# Patient Record
Sex: Female | Born: 1950 | Race: White | Hispanic: No | Smoking: Current every day smoker
Health system: Southern US, Community
[De-identification: ages and names within clinical notes are randomized; demographics above are authoritative.]

## PROBLEM LIST (undated history)

## (undated) DIAGNOSIS — M797 Fibromyalgia: Secondary | ICD-10-CM

## (undated) DIAGNOSIS — N390 Urinary tract infection, site not specified: Secondary | ICD-10-CM

## (undated) DIAGNOSIS — K8020 Calculus of gallbladder without cholecystitis without obstruction: Secondary | ICD-10-CM

## (undated) DIAGNOSIS — E785 Hyperlipidemia, unspecified: Secondary | ICD-10-CM

## (undated) DIAGNOSIS — F341 Dysthymic disorder: Secondary | ICD-10-CM

## (undated) DIAGNOSIS — N3010 Interstitial cystitis (chronic) without hematuria: Secondary | ICD-10-CM

## (undated) DIAGNOSIS — K635 Polyp of colon: Secondary | ICD-10-CM

## (undated) DIAGNOSIS — F32A Depression, unspecified: Secondary | ICD-10-CM

## (undated) DIAGNOSIS — I2510 Atherosclerotic heart disease of native coronary artery without angina pectoris: Secondary | ICD-10-CM

## (undated) DIAGNOSIS — F419 Anxiety disorder, unspecified: Secondary | ICD-10-CM

## (undated) DIAGNOSIS — I10 Essential (primary) hypertension: Secondary | ICD-10-CM

## (undated) DIAGNOSIS — M545 Low back pain, unspecified: Secondary | ICD-10-CM

## (undated) DIAGNOSIS — K589 Irritable bowel syndrome without diarrhea: Secondary | ICD-10-CM

## (undated) DIAGNOSIS — K839 Disease of biliary tract, unspecified: Secondary | ICD-10-CM

## (undated) DIAGNOSIS — K219 Gastro-esophageal reflux disease without esophagitis: Secondary | ICD-10-CM

## (undated) DIAGNOSIS — K5730 Diverticulosis of large intestine without perforation or abscess without bleeding: Secondary | ICD-10-CM

## (undated) DIAGNOSIS — Z8709 Personal history of other diseases of the respiratory system: Secondary | ICD-10-CM

## (undated) DIAGNOSIS — J189 Pneumonia, unspecified organism: Secondary | ICD-10-CM

## (undated) DIAGNOSIS — Z8619 Personal history of other infectious and parasitic diseases: Secondary | ICD-10-CM

## (undated) DIAGNOSIS — H209 Unspecified iridocyclitis: Secondary | ICD-10-CM

## (undated) DIAGNOSIS — R51 Headache: Secondary | ICD-10-CM

## (undated) DIAGNOSIS — F329 Major depressive disorder, single episode, unspecified: Secondary | ICD-10-CM

## (undated) HISTORY — DX: Interstitial cystitis (chronic) without hematuria: N30.10

## (undated) HISTORY — DX: Essential (primary) hypertension: I10

## (undated) HISTORY — DX: Gastro-esophageal reflux disease without esophagitis: K21.9

## (undated) HISTORY — PX: OTHER SURGICAL HISTORY: SHX169

## (undated) HISTORY — DX: Hyperlipidemia, unspecified: E78.5

## (undated) HISTORY — DX: Anxiety disorder, unspecified: F41.9

## (undated) HISTORY — DX: Unspecified iridocyclitis: H20.9

## (undated) HISTORY — DX: Low back pain, unspecified: M54.50

## (undated) HISTORY — DX: Atherosclerotic heart disease of native coronary artery without angina pectoris: I25.10

## (undated) HISTORY — DX: Depression, unspecified: F32.A

## (undated) HISTORY — DX: Calculus of gallbladder without cholecystitis without obstruction: K80.20

## (undated) HISTORY — DX: Low back pain: M54.5

## (undated) HISTORY — DX: Headache: R51

## (undated) HISTORY — DX: Diverticulosis of large intestine without perforation or abscess without bleeding: K57.30

## (undated) HISTORY — DX: Urinary tract infection, site not specified: N39.0

## (undated) HISTORY — DX: Personal history of other infectious and parasitic diseases: Z86.19

## (undated) HISTORY — DX: Fibromyalgia: M79.7

## (undated) HISTORY — DX: Pneumonia, unspecified organism: J18.9

## (undated) HISTORY — DX: Disease of biliary tract, unspecified: K83.9

## (undated) HISTORY — DX: Polyp of colon: K63.5

## (undated) HISTORY — PX: TUBAL LIGATION: SHX77

## (undated) HISTORY — DX: Major depressive disorder, single episode, unspecified: F32.9

## (undated) HISTORY — PX: TONSILLECTOMY: SUR1361

## (undated) HISTORY — DX: Personal history of other diseases of the respiratory system: Z87.09

## (undated) HISTORY — DX: Irritable bowel syndrome, unspecified: K58.9

## (undated) HISTORY — DX: Dysthymic disorder: F34.1

---

## 1971-07-20 HISTORY — PX: CHOLECYSTECTOMY: SHX55

## 1971-07-20 HISTORY — PX: APPENDECTOMY: SHX54

## 1987-07-20 HISTORY — PX: OTHER SURGICAL HISTORY: SHX169

## 1997-11-12 ENCOUNTER — Emergency Department (HOSPITAL_COMMUNITY): Admission: EM | Admit: 1997-11-12 | Discharge: 1997-11-12 | Payer: Self-pay | Admitting: Emergency Medicine

## 1997-11-19 ENCOUNTER — Encounter: Payer: Self-pay | Admitting: Gastroenterology

## 1998-09-30 ENCOUNTER — Encounter: Payer: Self-pay | Admitting: Pulmonary Disease

## 1998-09-30 ENCOUNTER — Ambulatory Visit (HOSPITAL_COMMUNITY): Admission: RE | Admit: 1998-09-30 | Discharge: 1998-09-30 | Payer: Self-pay | Admitting: Pulmonary Disease

## 1999-01-07 ENCOUNTER — Emergency Department (HOSPITAL_COMMUNITY): Admission: EM | Admit: 1999-01-07 | Discharge: 1999-01-07 | Payer: Self-pay | Admitting: Emergency Medicine

## 1999-06-24 ENCOUNTER — Emergency Department (HOSPITAL_COMMUNITY): Admission: EM | Admit: 1999-06-24 | Discharge: 1999-06-24 | Payer: Self-pay | Admitting: *Deleted

## 1999-07-08 ENCOUNTER — Encounter: Payer: Self-pay | Admitting: Emergency Medicine

## 1999-07-08 ENCOUNTER — Emergency Department (HOSPITAL_COMMUNITY): Admission: EM | Admit: 1999-07-08 | Discharge: 1999-07-08 | Payer: Self-pay | Admitting: Emergency Medicine

## 1999-08-26 ENCOUNTER — Emergency Department (HOSPITAL_COMMUNITY): Admission: EM | Admit: 1999-08-26 | Discharge: 1999-08-26 | Payer: Self-pay | Admitting: Emergency Medicine

## 1999-09-04 ENCOUNTER — Encounter: Admission: RE | Admit: 1999-09-04 | Discharge: 1999-09-04 | Payer: Self-pay | Admitting: Family Medicine

## 1999-10-06 ENCOUNTER — Encounter: Admission: RE | Admit: 1999-10-06 | Discharge: 1999-10-06 | Payer: Self-pay | Admitting: Sports Medicine

## 1999-10-21 ENCOUNTER — Ambulatory Visit (HOSPITAL_COMMUNITY): Admission: RE | Admit: 1999-10-21 | Discharge: 1999-10-21 | Payer: Self-pay | Admitting: *Deleted

## 1999-11-04 ENCOUNTER — Other Ambulatory Visit: Admission: RE | Admit: 1999-11-04 | Discharge: 1999-11-04 | Payer: Self-pay | Admitting: Family Medicine

## 1999-11-04 ENCOUNTER — Encounter: Admission: RE | Admit: 1999-11-04 | Discharge: 1999-11-04 | Payer: Self-pay | Admitting: Family Medicine

## 1999-11-17 ENCOUNTER — Encounter: Admission: RE | Admit: 1999-11-17 | Discharge: 1999-11-17 | Payer: Self-pay | Admitting: Family Medicine

## 1999-12-15 ENCOUNTER — Encounter: Admission: RE | Admit: 1999-12-15 | Discharge: 1999-12-15 | Payer: Self-pay | Admitting: Sports Medicine

## 2000-02-02 ENCOUNTER — Emergency Department (HOSPITAL_COMMUNITY): Admission: EM | Admit: 2000-02-02 | Discharge: 2000-02-02 | Payer: Self-pay | Admitting: Emergency Medicine

## 2000-06-10 ENCOUNTER — Emergency Department (HOSPITAL_COMMUNITY): Admission: EM | Admit: 2000-06-10 | Discharge: 2000-06-10 | Payer: Self-pay | Admitting: Emergency Medicine

## 2000-08-11 ENCOUNTER — Encounter: Admission: RE | Admit: 2000-08-11 | Discharge: 2000-08-11 | Payer: Self-pay | Admitting: Family Medicine

## 2000-11-03 ENCOUNTER — Encounter: Payer: Self-pay | Admitting: Pulmonary Disease

## 2000-11-03 ENCOUNTER — Ambulatory Visit (HOSPITAL_COMMUNITY): Admission: RE | Admit: 2000-11-03 | Discharge: 2000-11-03 | Payer: Self-pay | Admitting: Pulmonary Disease

## 2000-12-06 ENCOUNTER — Ambulatory Visit (HOSPITAL_COMMUNITY): Admission: RE | Admit: 2000-12-06 | Discharge: 2000-12-06 | Payer: Self-pay | Admitting: Sports Medicine

## 2000-12-06 ENCOUNTER — Encounter: Payer: Self-pay | Admitting: Sports Medicine

## 2001-01-02 ENCOUNTER — Emergency Department (HOSPITAL_COMMUNITY): Admission: EM | Admit: 2001-01-02 | Discharge: 2001-01-02 | Payer: Self-pay | Admitting: Emergency Medicine

## 2001-01-07 ENCOUNTER — Emergency Department (HOSPITAL_COMMUNITY): Admission: EM | Admit: 2001-01-07 | Discharge: 2001-01-08 | Payer: Self-pay | Admitting: Emergency Medicine

## 2001-01-07 ENCOUNTER — Encounter: Payer: Self-pay | Admitting: Emergency Medicine

## 2001-02-03 ENCOUNTER — Encounter: Payer: Self-pay | Admitting: Emergency Medicine

## 2001-02-03 ENCOUNTER — Emergency Department (HOSPITAL_COMMUNITY): Admission: EM | Admit: 2001-02-03 | Discharge: 2001-02-04 | Payer: Self-pay | Admitting: Emergency Medicine

## 2001-06-19 ENCOUNTER — Ambulatory Visit (HOSPITAL_COMMUNITY): Admission: RE | Admit: 2001-06-19 | Discharge: 2001-06-19 | Payer: Self-pay | Admitting: Sports Medicine

## 2001-06-19 ENCOUNTER — Encounter: Payer: Self-pay | Admitting: Sports Medicine

## 2001-11-28 ENCOUNTER — Encounter: Payer: Self-pay | Admitting: Pulmonary Disease

## 2001-11-28 ENCOUNTER — Ambulatory Visit (HOSPITAL_COMMUNITY): Admission: RE | Admit: 2001-11-28 | Discharge: 2001-11-28 | Payer: Self-pay | Admitting: Pulmonary Disease

## 2002-02-05 ENCOUNTER — Encounter: Payer: Self-pay | Admitting: Emergency Medicine

## 2002-02-05 ENCOUNTER — Emergency Department (HOSPITAL_COMMUNITY): Admission: EM | Admit: 2002-02-05 | Discharge: 2002-02-05 | Payer: Self-pay | Admitting: Emergency Medicine

## 2002-12-26 ENCOUNTER — Emergency Department (HOSPITAL_COMMUNITY): Admission: EM | Admit: 2002-12-26 | Discharge: 2002-12-27 | Payer: Self-pay | Admitting: Emergency Medicine

## 2003-01-10 ENCOUNTER — Emergency Department (HOSPITAL_COMMUNITY): Admission: EM | Admit: 2003-01-10 | Discharge: 2003-01-10 | Payer: Self-pay | Admitting: Emergency Medicine

## 2003-02-12 ENCOUNTER — Ambulatory Visit (HOSPITAL_COMMUNITY): Admission: RE | Admit: 2003-02-12 | Discharge: 2003-02-12 | Payer: Self-pay | Admitting: Pulmonary Disease

## 2003-02-12 ENCOUNTER — Encounter: Payer: Self-pay | Admitting: Pulmonary Disease

## 2003-07-25 ENCOUNTER — Emergency Department (HOSPITAL_COMMUNITY): Admission: EM | Admit: 2003-07-25 | Discharge: 2003-07-25 | Payer: Self-pay | Admitting: Emergency Medicine

## 2004-08-03 ENCOUNTER — Ambulatory Visit: Payer: Self-pay | Admitting: Pulmonary Disease

## 2004-08-11 ENCOUNTER — Ambulatory Visit: Payer: Self-pay | Admitting: Pulmonary Disease

## 2004-10-09 ENCOUNTER — Emergency Department (HOSPITAL_COMMUNITY): Admission: EM | Admit: 2004-10-09 | Discharge: 2004-10-09 | Payer: Self-pay | Admitting: Family Medicine

## 2005-01-05 ENCOUNTER — Ambulatory Visit: Payer: Self-pay | Admitting: Family Medicine

## 2005-01-05 ENCOUNTER — Encounter (INDEPENDENT_AMBULATORY_CARE_PROVIDER_SITE_OTHER): Payer: Self-pay | Admitting: *Deleted

## 2005-01-21 ENCOUNTER — Encounter (INDEPENDENT_AMBULATORY_CARE_PROVIDER_SITE_OTHER): Payer: Self-pay | Admitting: *Deleted

## 2005-02-09 ENCOUNTER — Ambulatory Visit: Payer: Self-pay | Admitting: Family Medicine

## 2005-03-08 ENCOUNTER — Ambulatory Visit (HOSPITAL_COMMUNITY): Admission: RE | Admit: 2005-03-08 | Discharge: 2005-03-08 | Payer: Self-pay | Admitting: Family Medicine

## 2005-03-23 ENCOUNTER — Encounter: Admission: RE | Admit: 2005-03-23 | Discharge: 2005-03-23 | Payer: Self-pay | Admitting: Sports Medicine

## 2005-05-12 ENCOUNTER — Ambulatory Visit: Payer: Self-pay | Admitting: Pulmonary Disease

## 2005-10-15 ENCOUNTER — Emergency Department: Payer: Self-pay | Admitting: Emergency Medicine

## 2005-11-17 ENCOUNTER — Ambulatory Visit: Payer: Self-pay | Admitting: Family Medicine

## 2005-12-16 ENCOUNTER — Ambulatory Visit: Payer: Self-pay | Admitting: Physician Assistant

## 2005-12-29 ENCOUNTER — Emergency Department: Payer: Self-pay | Admitting: Emergency Medicine

## 2006-01-05 ENCOUNTER — Emergency Department: Payer: Self-pay | Admitting: Unknown Physician Specialty

## 2006-03-28 ENCOUNTER — Emergency Department: Payer: Self-pay | Admitting: Emergency Medicine

## 2006-05-11 ENCOUNTER — Ambulatory Visit: Payer: Self-pay | Admitting: Pulmonary Disease

## 2006-05-30 ENCOUNTER — Ambulatory Visit: Payer: Self-pay | Admitting: Pulmonary Disease

## 2006-05-30 LAB — CONVERTED CEMR LAB
Crystals: NEGATIVE
Ketones, ur: NEGATIVE mg/dL
Specific Gravity, Urine: >=1.03 (ref 1.000–1.03)
Urine Glucose: NEGATIVE mg/dL
Urobilinogen, UA: 0.2 (ref 0.0–1.0)
pH: 5.5 (ref 5.0–8.0)

## 2006-06-13 ENCOUNTER — Ambulatory Visit: Payer: Self-pay | Admitting: Pulmonary Disease

## 2006-08-18 ENCOUNTER — Ambulatory Visit: Payer: Self-pay | Admitting: Urology

## 2006-09-16 ENCOUNTER — Encounter (INDEPENDENT_AMBULATORY_CARE_PROVIDER_SITE_OTHER): Payer: Self-pay | Admitting: *Deleted

## 2006-09-21 ENCOUNTER — Ambulatory Visit: Payer: Self-pay | Admitting: Pulmonary Disease

## 2006-10-07 ENCOUNTER — Emergency Department (HOSPITAL_COMMUNITY): Admission: EM | Admit: 2006-10-07 | Discharge: 2006-10-07 | Payer: Self-pay | Admitting: Emergency Medicine

## 2006-10-12 ENCOUNTER — Ambulatory Visit: Payer: Self-pay | Admitting: Gastroenterology

## 2006-10-12 LAB — CONVERTED CEMR LAB
ALT: 19 units/L (ref 0–40)
AST: 19 units/L (ref 0–37)
Albumin: 3.8 g/dL (ref 3.5–5.2)
Amylase: 68 units/L (ref 27–131)
Total Bilirubin: 0.5 mg/dL (ref 0.3–1.2)

## 2006-10-13 ENCOUNTER — Ambulatory Visit (HOSPITAL_COMMUNITY): Admission: RE | Admit: 2006-10-13 | Discharge: 2006-10-13 | Payer: Self-pay | Admitting: Gastroenterology

## 2006-10-13 ENCOUNTER — Encounter (INDEPENDENT_AMBULATORY_CARE_PROVIDER_SITE_OTHER): Payer: Self-pay | Admitting: *Deleted

## 2006-10-17 ENCOUNTER — Inpatient Hospital Stay (HOSPITAL_COMMUNITY): Admission: EM | Admit: 2006-10-17 | Discharge: 2006-10-19 | Payer: Self-pay | Admitting: Emergency Medicine

## 2006-10-18 ENCOUNTER — Ambulatory Visit: Payer: Self-pay | Admitting: Gastroenterology

## 2006-10-25 ENCOUNTER — Ambulatory Visit: Payer: Self-pay | Admitting: Gastroenterology

## 2006-11-01 ENCOUNTER — Ambulatory Visit: Payer: Self-pay | Admitting: Gastroenterology

## 2006-11-01 LAB — CONVERTED CEMR LAB
AST: 32 units/L (ref 0–37)
Alkaline Phosphatase: 118 units/L — ABNORMAL HIGH (ref 39–117)
Ammonia: 15 umol/L (ref 11–35)
Bilirubin, Direct: 0.1 mg/dL (ref 0.0–0.3)
Total Protein: 6.5 g/dL (ref 6.0–8.3)

## 2006-11-02 ENCOUNTER — Ambulatory Visit: Payer: Self-pay | Admitting: Gastroenterology

## 2006-11-04 ENCOUNTER — Ambulatory Visit (HOSPITAL_COMMUNITY): Admission: RE | Admit: 2006-11-04 | Discharge: 2006-11-04 | Payer: Self-pay | Admitting: Gastroenterology

## 2006-11-04 ENCOUNTER — Encounter: Payer: Self-pay | Admitting: Gastroenterology

## 2006-12-05 ENCOUNTER — Emergency Department: Payer: Self-pay | Admitting: Emergency Medicine

## 2006-12-14 ENCOUNTER — Ambulatory Visit: Payer: Self-pay | Admitting: Gastroenterology

## 2007-01-09 ENCOUNTER — Ambulatory Visit: Payer: Self-pay | Admitting: Gastroenterology

## 2007-05-18 ENCOUNTER — Ambulatory Visit: Payer: Self-pay | Admitting: Pulmonary Disease

## 2007-05-18 DIAGNOSIS — K219 Gastro-esophageal reflux disease without esophagitis: Secondary | ICD-10-CM | POA: Insufficient documentation

## 2007-05-18 DIAGNOSIS — R519 Headache, unspecified: Secondary | ICD-10-CM | POA: Insufficient documentation

## 2007-05-18 DIAGNOSIS — K589 Irritable bowel syndrome without diarrhea: Secondary | ICD-10-CM | POA: Insufficient documentation

## 2007-05-18 DIAGNOSIS — M545 Low back pain: Secondary | ICD-10-CM | POA: Insufficient documentation

## 2007-05-18 DIAGNOSIS — IMO0001 Reserved for inherently not codable concepts without codable children: Secondary | ICD-10-CM

## 2007-05-18 DIAGNOSIS — R51 Headache: Secondary | ICD-10-CM

## 2007-06-01 ENCOUNTER — Ambulatory Visit: Payer: Self-pay | Admitting: Pulmonary Disease

## 2007-08-30 ENCOUNTER — Ambulatory Visit: Payer: Self-pay | Admitting: Pulmonary Disease

## 2007-09-04 ENCOUNTER — Telehealth (INDEPENDENT_AMBULATORY_CARE_PROVIDER_SITE_OTHER): Payer: Self-pay | Admitting: *Deleted

## 2007-09-06 ENCOUNTER — Ambulatory Visit: Payer: Self-pay | Admitting: Pulmonary Disease

## 2007-09-13 ENCOUNTER — Telehealth (INDEPENDENT_AMBULATORY_CARE_PROVIDER_SITE_OTHER): Payer: Self-pay | Admitting: *Deleted

## 2007-09-14 ENCOUNTER — Ambulatory Visit: Payer: Self-pay | Admitting: Pulmonary Disease

## 2007-09-16 DIAGNOSIS — N3010 Interstitial cystitis (chronic) without hematuria: Secondary | ICD-10-CM

## 2007-09-16 DIAGNOSIS — K5730 Diverticulosis of large intestine without perforation or abscess without bleeding: Secondary | ICD-10-CM | POA: Insufficient documentation

## 2007-09-16 DIAGNOSIS — H209 Unspecified iridocyclitis: Secondary | ICD-10-CM

## 2007-09-16 DIAGNOSIS — N390 Urinary tract infection, site not specified: Secondary | ICD-10-CM

## 2007-09-16 DIAGNOSIS — F341 Dysthymic disorder: Secondary | ICD-10-CM

## 2007-09-16 DIAGNOSIS — F17200 Nicotine dependence, unspecified, uncomplicated: Secondary | ICD-10-CM

## 2007-09-16 DIAGNOSIS — K839 Disease of biliary tract, unspecified: Secondary | ICD-10-CM | POA: Insufficient documentation

## 2007-09-16 DIAGNOSIS — J40 Bronchitis, not specified as acute or chronic: Secondary | ICD-10-CM

## 2007-09-16 LAB — CONVERTED CEMR LAB
Alkaline Phosphatase: 101 units/L (ref 39–117)
Angiotensin 1 Converting Enzyme: 42 units/L (ref 9–67)
Anti Nuclear Antibody(ANA): NEGATIVE
BUN: 4 mg/dL — ABNORMAL LOW (ref 6–23)
Basophils Relative: 0.9 % (ref 0.0–1.0)
Bilirubin, Direct: 0.1 mg/dL (ref 0.0–0.3)
CO2: 32 meq/L (ref 19–32)
Creatinine, Ser: 0.6 mg/dL (ref 0.4–1.2)
Crystals: NEGATIVE
GFR calc Af Amer: 133 mL/min
HCT: 43.1 % (ref 36.0–46.0)
HDL: 55.1 mg/dL (ref >39.0)
Hemoglobin: 13.9 g/dL (ref 12.0–15.0)
Ketones, ur: NEGATIVE mg/dL
Lymphocytes Relative: 25.5 % (ref 12.0–46.0)
MCHC: 32.4 g/dL (ref 30.0–36.0)
Monocytes Relative: 8.3 % (ref 3.0–11.0)
Neutro Abs: 4.5 10*3/uL (ref 1.4–7.7)
Nitrite: NEGATIVE
Potassium: 3.8 meq/L (ref 3.5–5.1)
RBC: 4.48 M/uL (ref 3.87–5.11)
RDW: 12.6 % (ref 11.5–14.6)
Sed Rate: 30 mm/hr — ABNORMAL HIGH (ref 0–25)
Sodium: 138 meq/L (ref 135–145)
Specific Gravity, Urine: 1.01 (ref 1.000–1.03)
TSH: 0.69 microintl units/mL (ref 0.35–5.50)
Total Bilirubin: 0.7 mg/dL (ref 0.3–1.2)
Total Protein, Urine: NEGATIVE mg/dL
Triglycerides: 75 mg/dL (ref 0–149)
Urine Glucose: NEGATIVE mg/dL
WBC: 7.4 10*3/uL (ref 4.5–10.5)
pH: 6 (ref 5.0–8.0)

## 2007-11-11 DIAGNOSIS — T733XXA Exhaustion due to excessive exertion, initial encounter: Secondary | ICD-10-CM

## 2007-11-11 DIAGNOSIS — M2550 Pain in unspecified joint: Secondary | ICD-10-CM | POA: Insufficient documentation

## 2007-11-11 DIAGNOSIS — M549 Dorsalgia, unspecified: Secondary | ICD-10-CM | POA: Insufficient documentation

## 2007-11-29 ENCOUNTER — Emergency Department: Payer: Self-pay | Admitting: Unknown Physician Specialty

## 2008-01-01 ENCOUNTER — Ambulatory Visit: Payer: Self-pay | Admitting: Gynecology

## 2008-02-21 ENCOUNTER — Telehealth: Payer: Self-pay | Admitting: Pulmonary Disease

## 2008-02-26 ENCOUNTER — Ambulatory Visit: Payer: Self-pay | Admitting: Pulmonary Disease

## 2008-03-16 ENCOUNTER — Emergency Department: Payer: Self-pay | Admitting: Emergency Medicine

## 2008-04-23 IMAGING — CR DG CHEST 2V
1 series · 1 of 1 positions shown · non-contrast
Comparison: 05/11/06.

CLINICAL DATA: Right-sided pain, smoker.
 CHEST - 2 VIEW - 08/30/07:

[view not recorded]
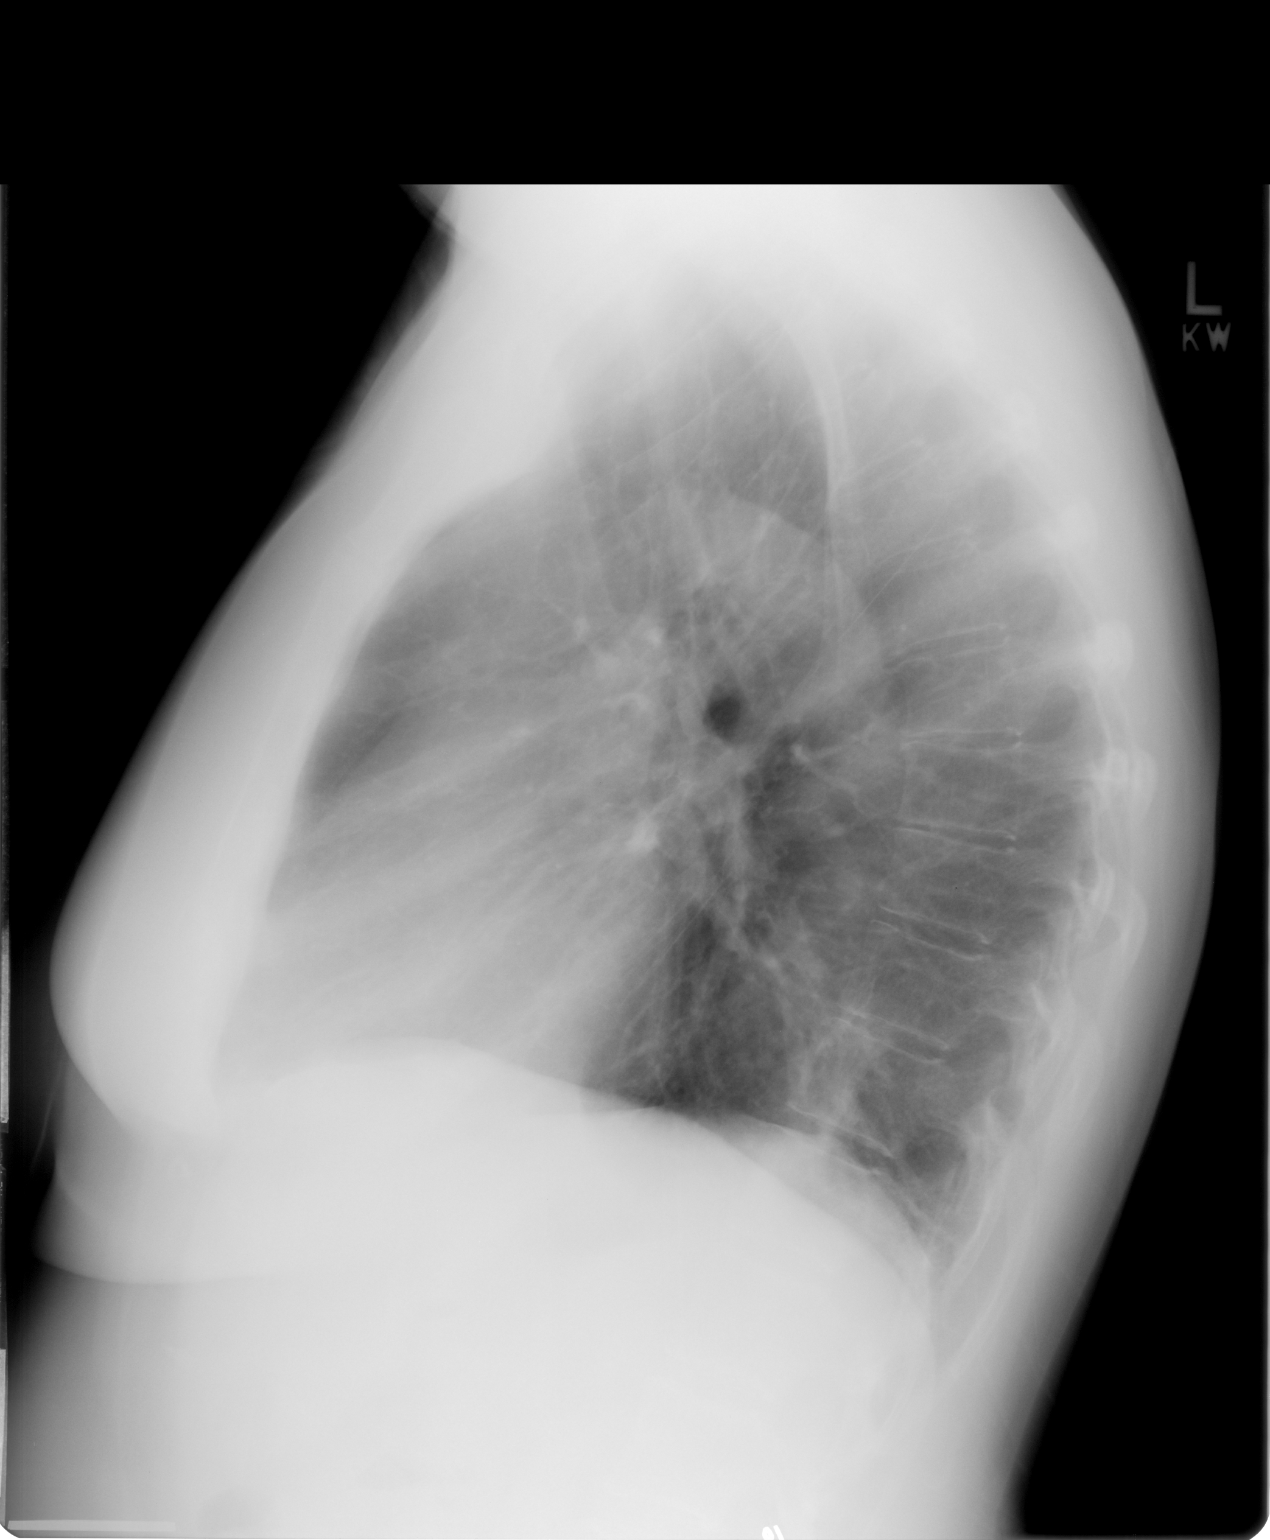

[1 of 1 positions shown; findings below may reference images not displayed]

FINDINGS: Trachea is midline.  Heart size normal.  Lungs are clear.  No pleural fluid.
IMPRESSION: No acute findings.

## 2008-04-24 ENCOUNTER — Ambulatory Visit: Payer: Self-pay | Admitting: Pulmonary Disease

## 2008-05-01 ENCOUNTER — Telehealth (INDEPENDENT_AMBULATORY_CARE_PROVIDER_SITE_OTHER): Payer: Self-pay | Admitting: *Deleted

## 2008-07-29 ENCOUNTER — Telehealth: Payer: Self-pay | Admitting: Pulmonary Disease

## 2008-11-14 ENCOUNTER — Ambulatory Visit: Payer: Self-pay | Admitting: Pulmonary Disease

## 2008-11-14 DIAGNOSIS — K5289 Other specified noninfective gastroenteritis and colitis: Secondary | ICD-10-CM

## 2008-11-15 ENCOUNTER — Encounter: Payer: Self-pay | Admitting: Adult Health

## 2008-11-15 ENCOUNTER — Telehealth: Payer: Self-pay | Admitting: Adult Health

## 2008-11-15 LAB — CONVERTED CEMR LAB
Ketones, ur: NEGATIVE mg/dL
Specific Gravity, Urine: 1.015 (ref 1.000–1.030)
Urine Glucose: NEGATIVE mg/dL
Urobilinogen, UA: 0.2 (ref 0.0–1.0)

## 2008-11-27 ENCOUNTER — Telehealth: Payer: Self-pay | Admitting: Pulmonary Disease

## 2008-11-29 ENCOUNTER — Telehealth: Payer: Self-pay | Admitting: Pulmonary Disease

## 2008-11-29 ENCOUNTER — Ambulatory Visit: Payer: Self-pay | Admitting: Pulmonary Disease

## 2008-12-04 LAB — CONVERTED CEMR LAB
Hemoglobin, Urine: NEGATIVE
Specific Gravity, Urine: 1.015 (ref 1.000–1.030)
Total Protein, Urine: NEGATIVE mg/dL
Urine Glucose: NEGATIVE mg/dL
Urobilinogen, UA: 0.2 (ref 0.0–1.0)
pH: 5 (ref 5.0–8.0)

## 2008-12-09 ENCOUNTER — Encounter: Payer: Self-pay | Admitting: Pulmonary Disease

## 2008-12-12 ENCOUNTER — Ambulatory Visit: Payer: Self-pay | Admitting: Pulmonary Disease

## 2008-12-27 ENCOUNTER — Telehealth (INDEPENDENT_AMBULATORY_CARE_PROVIDER_SITE_OTHER): Payer: Self-pay | Admitting: *Deleted

## 2008-12-30 ENCOUNTER — Encounter (INDEPENDENT_AMBULATORY_CARE_PROVIDER_SITE_OTHER): Payer: Self-pay | Admitting: *Deleted

## 2008-12-30 ENCOUNTER — Emergency Department (HOSPITAL_COMMUNITY): Admission: EM | Admit: 2008-12-30 | Discharge: 2008-12-30 | Payer: Self-pay | Admitting: Emergency Medicine

## 2009-01-13 ENCOUNTER — Encounter: Payer: Self-pay | Admitting: Pulmonary Disease

## 2009-01-29 ENCOUNTER — Ambulatory Visit: Payer: Self-pay | Admitting: Gastroenterology

## 2009-01-29 DIAGNOSIS — K625 Hemorrhage of anus and rectum: Secondary | ICD-10-CM

## 2009-01-29 DIAGNOSIS — R1011 Right upper quadrant pain: Secondary | ICD-10-CM

## 2009-01-31 ENCOUNTER — Ambulatory Visit (HOSPITAL_COMMUNITY): Admission: RE | Admit: 2009-01-31 | Discharge: 2009-01-31 | Payer: Self-pay | Admitting: Gastroenterology

## 2009-02-03 ENCOUNTER — Ambulatory Visit: Payer: Self-pay | Admitting: Gastroenterology

## 2009-02-03 ENCOUNTER — Ambulatory Visit (HOSPITAL_COMMUNITY): Admission: RE | Admit: 2009-02-03 | Discharge: 2009-02-03 | Payer: Self-pay | Admitting: Gastroenterology

## 2009-02-04 ENCOUNTER — Encounter: Payer: Self-pay | Admitting: Gastroenterology

## 2009-02-10 ENCOUNTER — Encounter: Payer: Self-pay | Admitting: Pulmonary Disease

## 2009-03-17 ENCOUNTER — Ambulatory Visit: Payer: Self-pay | Admitting: Gastroenterology

## 2009-04-10 ENCOUNTER — Encounter: Payer: Self-pay | Admitting: Gastroenterology

## 2009-04-10 ENCOUNTER — Ambulatory Visit (HOSPITAL_COMMUNITY): Admission: RE | Admit: 2009-04-10 | Discharge: 2009-04-10 | Payer: Self-pay | Admitting: Gastroenterology

## 2009-04-10 ENCOUNTER — Ambulatory Visit: Payer: Self-pay | Admitting: Gastroenterology

## 2009-04-14 ENCOUNTER — Encounter: Payer: Self-pay | Admitting: Gastroenterology

## 2009-04-25 ENCOUNTER — Ambulatory Visit: Payer: Self-pay | Admitting: Pulmonary Disease

## 2009-05-29 ENCOUNTER — Ambulatory Visit: Payer: Self-pay | Admitting: Gastroenterology

## 2009-05-29 DIAGNOSIS — Z860100 Personal history of colon polyps, unspecified: Secondary | ICD-10-CM | POA: Insufficient documentation

## 2009-05-29 DIAGNOSIS — E538 Deficiency of other specified B group vitamins: Secondary | ICD-10-CM | POA: Insufficient documentation

## 2009-06-16 ENCOUNTER — Ambulatory Visit: Payer: Self-pay | Admitting: Pulmonary Disease

## 2009-06-16 DIAGNOSIS — J209 Acute bronchitis, unspecified: Secondary | ICD-10-CM

## 2009-06-17 LAB — CONVERTED CEMR LAB
Alkaline Phosphatase: 181 units/L — ABNORMAL HIGH (ref 39–117)
BUN: 7 mg/dL (ref 6–23)
Basophils Relative: 0.6 % (ref 0.0–3.0)
Bilirubin, Direct: 0.1 mg/dL (ref 0.0–0.3)
CO2: 29 meq/L (ref 19–32)
Calcium: 9.1 mg/dL (ref 8.4–10.5)
Cholesterol: 192 mg/dL (ref 0–200)
Creatinine, Ser: 0.6 mg/dL (ref 0.4–1.2)
Folate: 7.7 ng/mL
GFR calc non Af Amer: 109.13 mL/min (ref >60)
Glucose, Bld: 112 mg/dL — ABNORMAL HIGH (ref 70–99)
HCT: 40.3 % (ref 36.0–46.0)
HDL: 68.8 mg/dL (ref >39.00)
Hemoglobin: 13.4 g/dL (ref 12.0–15.0)
Hgb A1c MFr Bld: 5.7 % (ref 4.6–6.5)
Iron: 55 ug/dL (ref 42–145)
LDL Cholesterol: 109 mg/dL — ABNORMAL HIGH (ref 0–99)
MCV: 96.9 fL (ref 78.0–100.0)
Neutro Abs: 4.9 10*3/uL (ref 1.4–7.7)
RBC: 4.16 M/uL (ref 3.87–5.11)
RDW: 13 % (ref 11.5–14.6)
Total Bilirubin: 0.6 mg/dL (ref 0.3–1.2)
Transferrin: 235.8 mg/dL (ref 212.0–360.0)
Vitamin B-12: 320 pg/mL (ref 211–911)

## 2009-06-19 ENCOUNTER — Ambulatory Visit: Payer: Self-pay | Admitting: Gastroenterology

## 2009-06-19 DIAGNOSIS — R933 Abnormal findings on diagnostic imaging of other parts of digestive tract: Secondary | ICD-10-CM

## 2009-06-19 DIAGNOSIS — R945 Abnormal results of liver function studies: Secondary | ICD-10-CM

## 2009-06-23 ENCOUNTER — Ambulatory Visit (HOSPITAL_COMMUNITY): Admission: RE | Admit: 2009-06-23 | Discharge: 2009-06-23 | Payer: Self-pay | Admitting: Gastroenterology

## 2009-07-14 ENCOUNTER — Telehealth: Payer: Self-pay | Admitting: Gastroenterology

## 2009-07-22 ENCOUNTER — Encounter: Payer: Self-pay | Admitting: Gastroenterology

## 2009-07-22 ENCOUNTER — Telehealth: Payer: Self-pay | Admitting: Pulmonary Disease

## 2009-07-23 ENCOUNTER — Ambulatory Visit: Payer: Self-pay | Admitting: Gastroenterology

## 2009-07-23 ENCOUNTER — Ambulatory Visit: Payer: Self-pay | Admitting: Pulmonary Disease

## 2009-07-24 ENCOUNTER — Encounter: Payer: Self-pay | Admitting: Pulmonary Disease

## 2009-07-24 LAB — CONVERTED CEMR LAB
ALT: 22 units/L (ref 0–35)
AST: 22 units/L (ref 0–37)
Alkaline Phosphatase: 103 units/L (ref 39–117)
Bilirubin Urine: NEGATIVE
Bilirubin, Direct: 0 mg/dL (ref 0.0–0.3)
Nitrite: NEGATIVE
Total Bilirubin: 0.6 mg/dL (ref 0.3–1.2)
Total Protein, Urine: NEGATIVE mg/dL
Urine Glucose: NEGATIVE mg/dL
pH: 6.5 (ref 5.0–8.0)

## 2009-09-25 IMAGING — US US ABDOMEN COMPLETE
1 series · 14 of 25 positions shown · non-contrast
Comparison: None

CLINICAL DATA: Abdominal pain.

COMPLETE ABDOMINAL ULTRASOUND

[Series 1: us abdomen complete · 0.26mm/px · 14 of 71 slices shown]
[im 1/71]
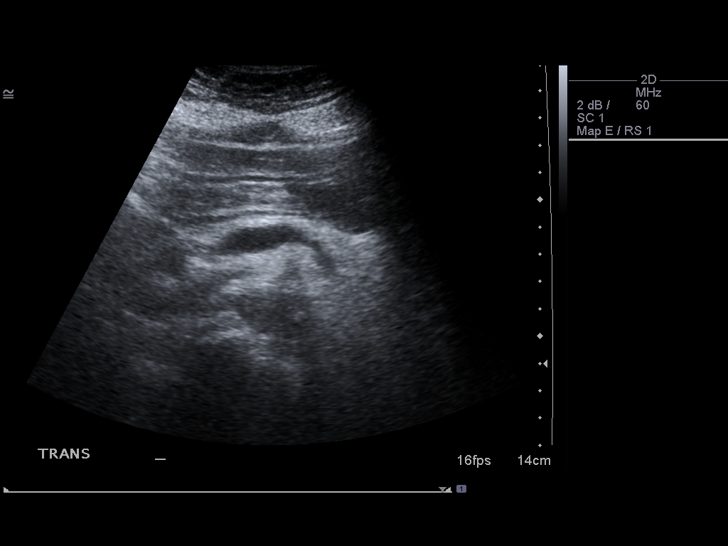
[im 6/71]
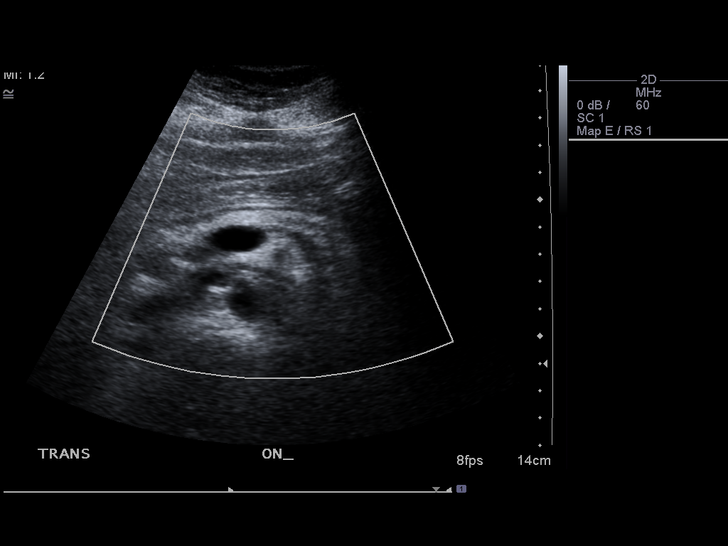
[im 12/71]
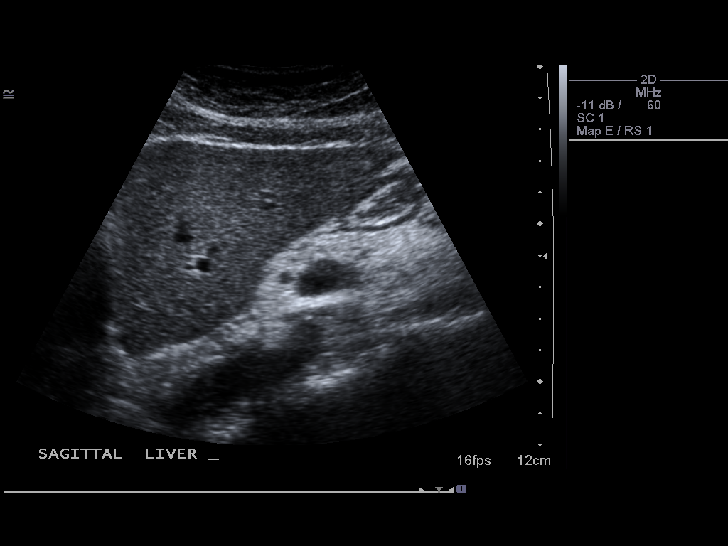
[im 18/71]
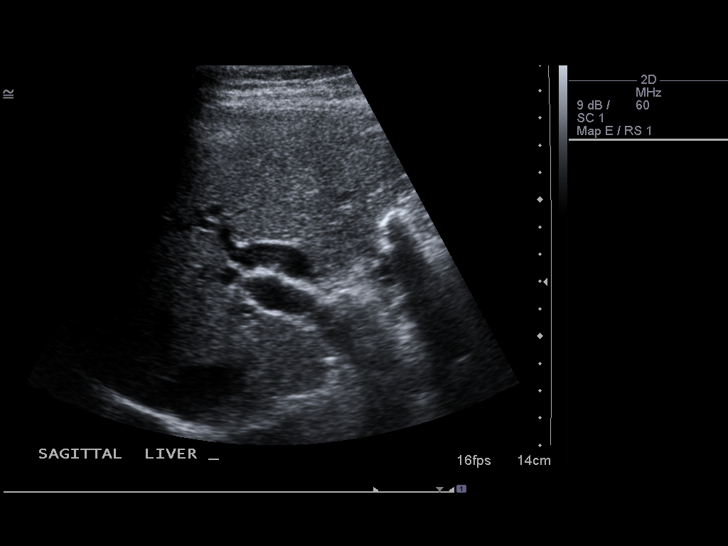
[im 24/71]
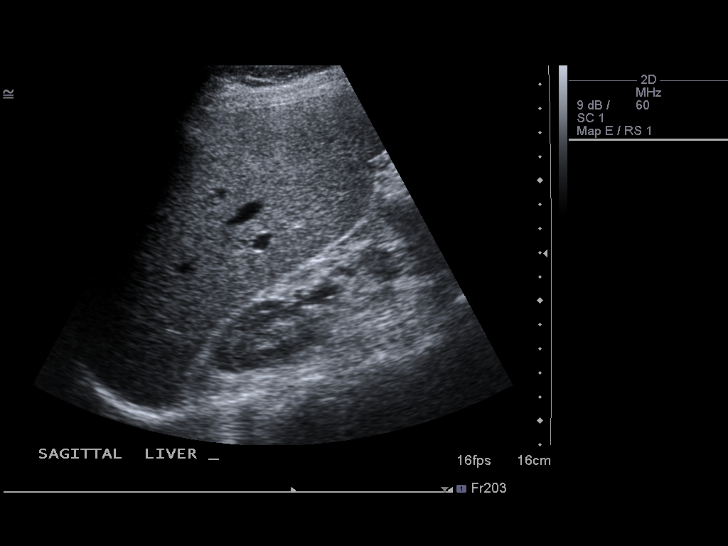
[im 27/71]
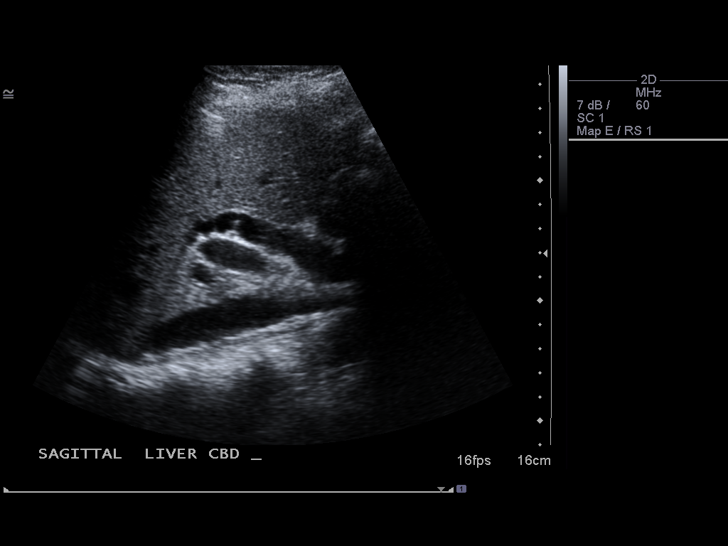
[im 33/71]
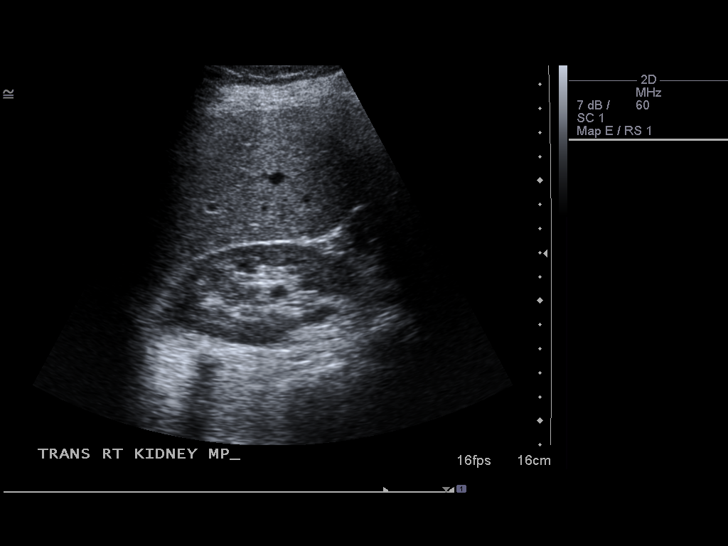
[im 38/71]
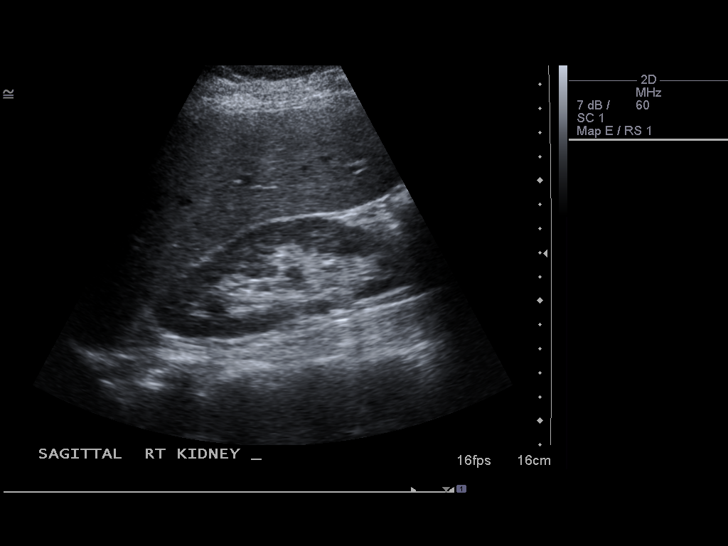
[im 44/71]
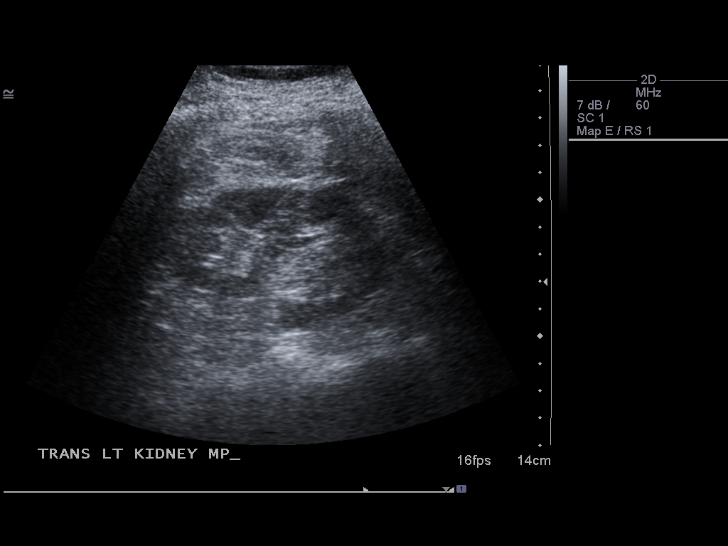
[im 47/71]
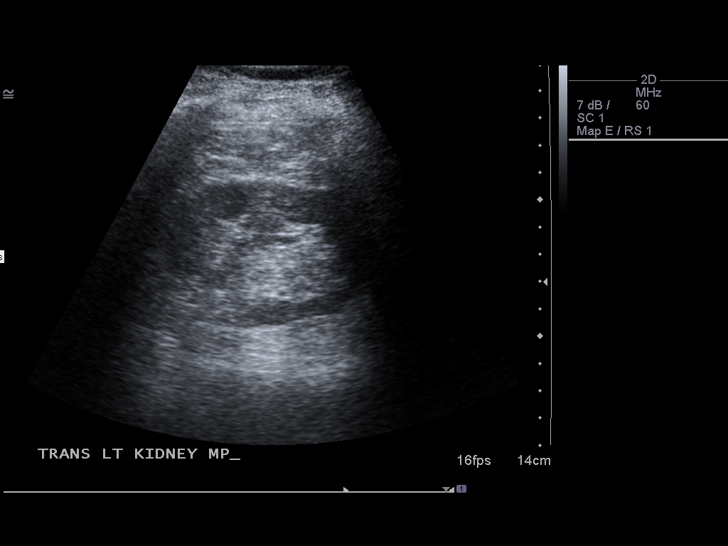
[im 53/71]
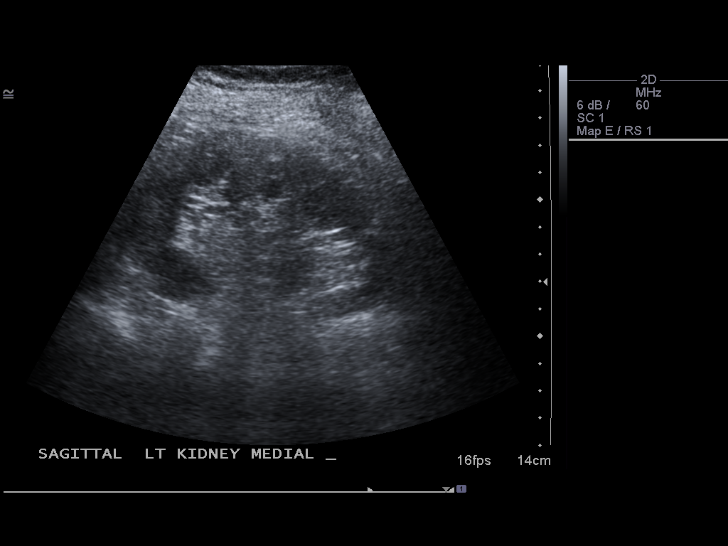
[im 59/71]
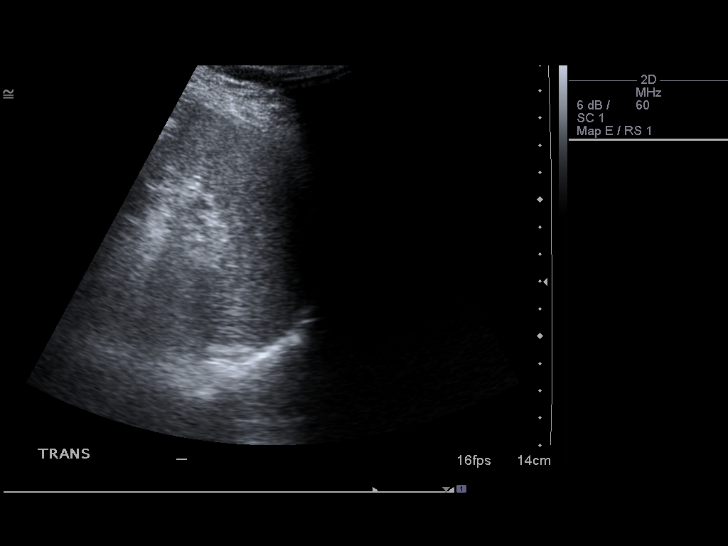
[im 65/71]
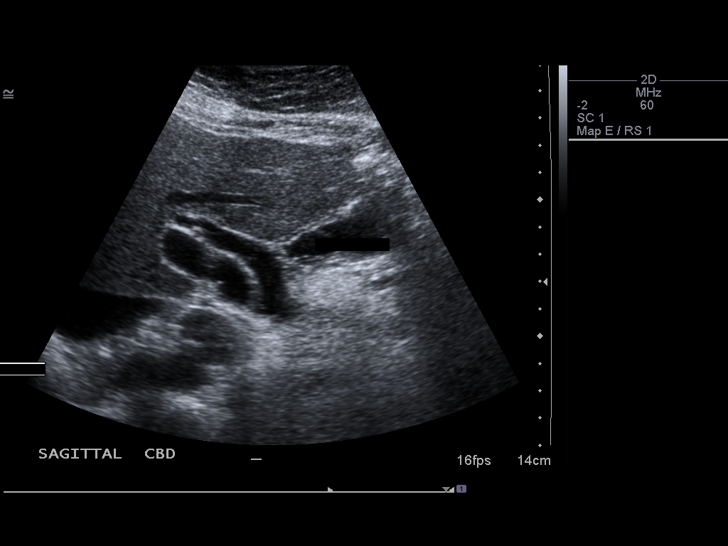
[im 71/71]
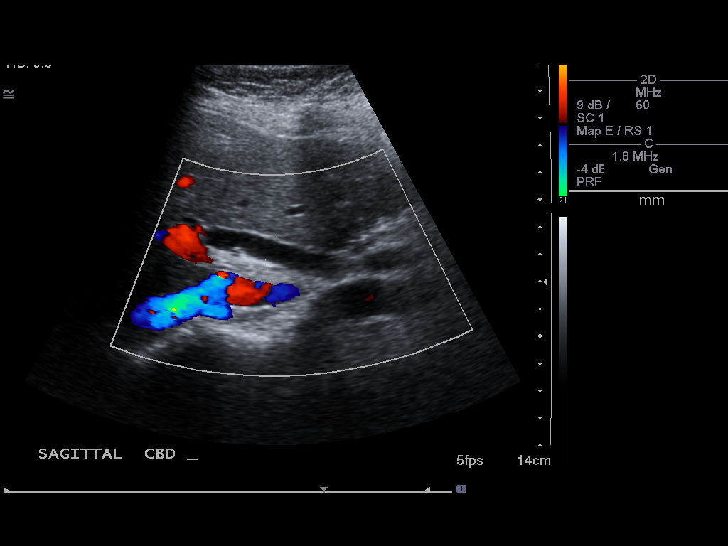

[14 of 25 positions shown; findings below may reference images not displayed]

FINDINGS: Cholecystectomy.  Common duct measures 1.4 mm in
diameter.  There is a 1 cm right hepatic lobe cyst.  Patent IVC.
Pancreas unremarkable.  Spleen appears normal measuring 6.8 cm in
length.  The right and left kidneys measure 11.6 cm 11.3 cm in
length, respectively.  5 x 4 x 4 mm echogenic lesion in the right
kidney may represent a small angiomyolipoma.  Maximum diameter
abdominal aorta is 2.0 cm.
IMPRESSION: Cholecystectomy.  Biliary ductal dilatation.  No obstructing lesion
is appreciated. Small echogenic lesion in the right kidney may
represent a small AML.

## 2009-09-28 IMAGING — RF DG ERCP WO/W SPHINCTEROTOMY
1 series · 4 of 4 positions shown · non-contrast
Comparison: Ultrasound dated 01/31/2009 and ERCP with
sphincterotomy dated 10/13/2006.

CLINICAL DATA: Abdominal pain.  Biliary ductal dilatation at recent
ultrasound.  Previous cholecystectomy.  Previous common bile duct
stricture, sphincterotomy and biliary stent placement.

ERCP
Fluoroscopy Time: 1 minute 37 seconds.

[Series 1: run · 4 of 4 slices shown]
[im 1/4]
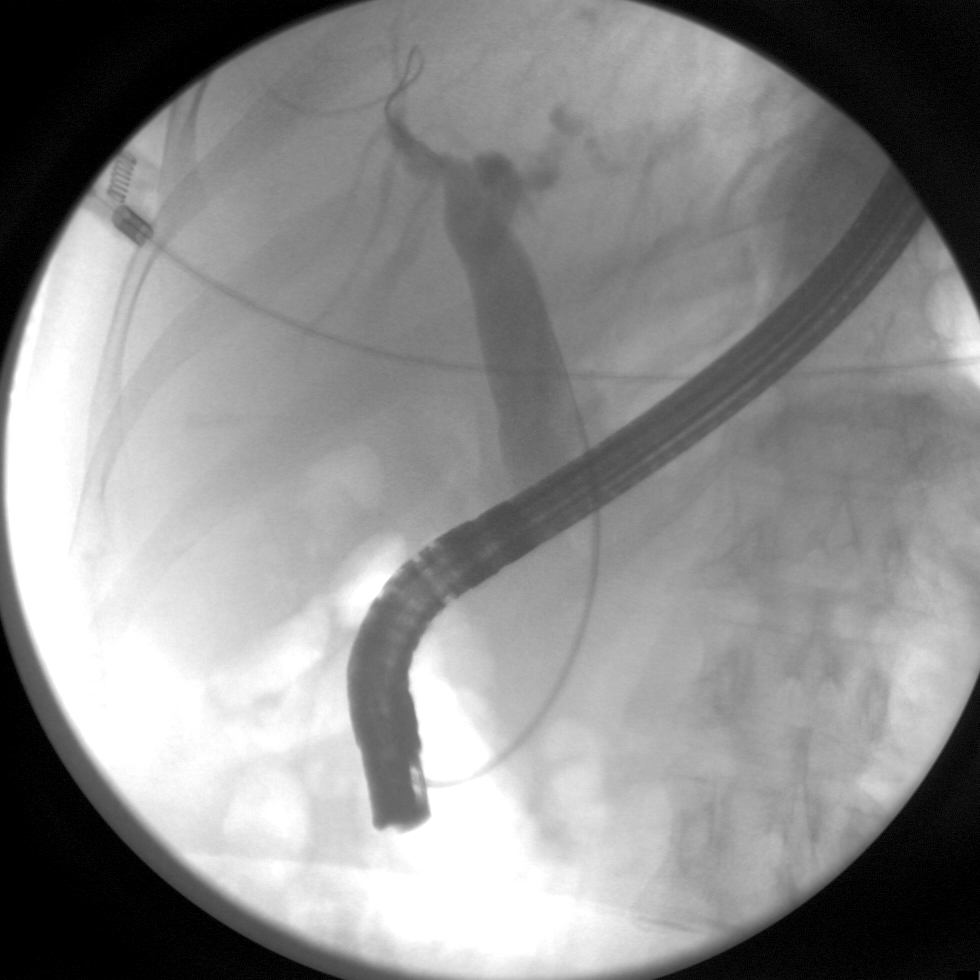
[im 2/4]
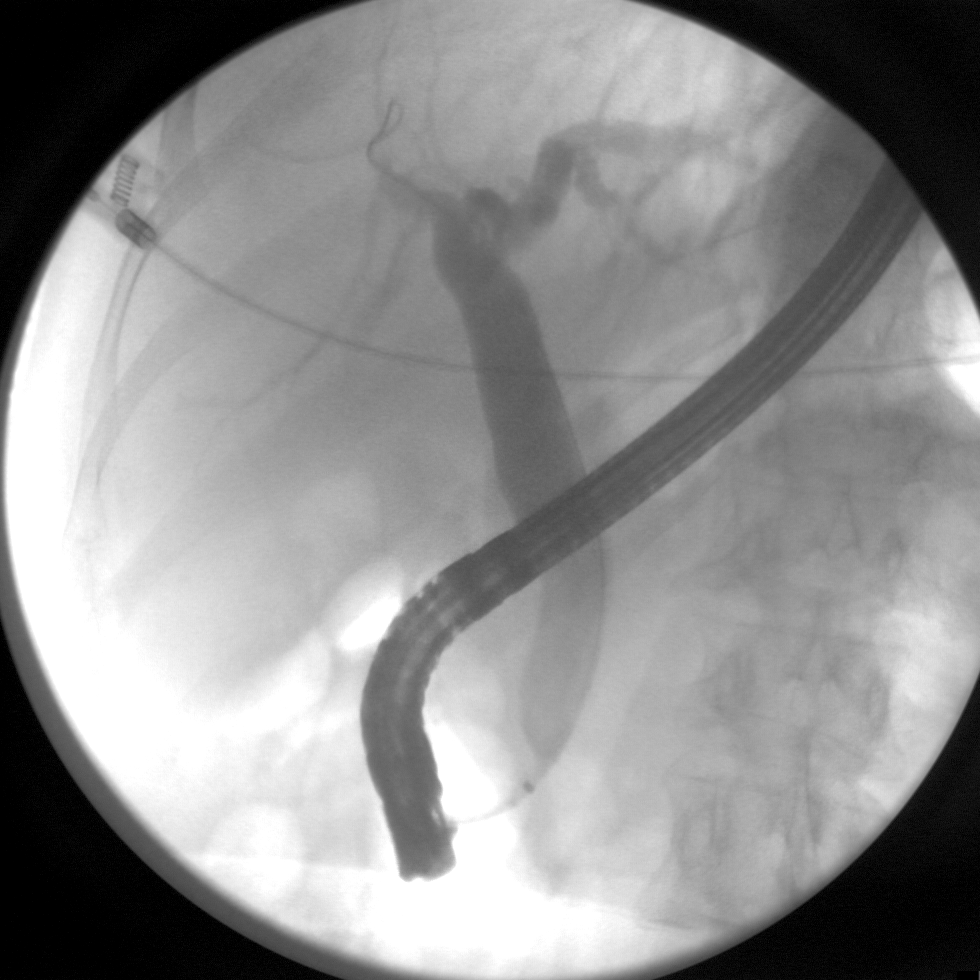
[im 3/4]
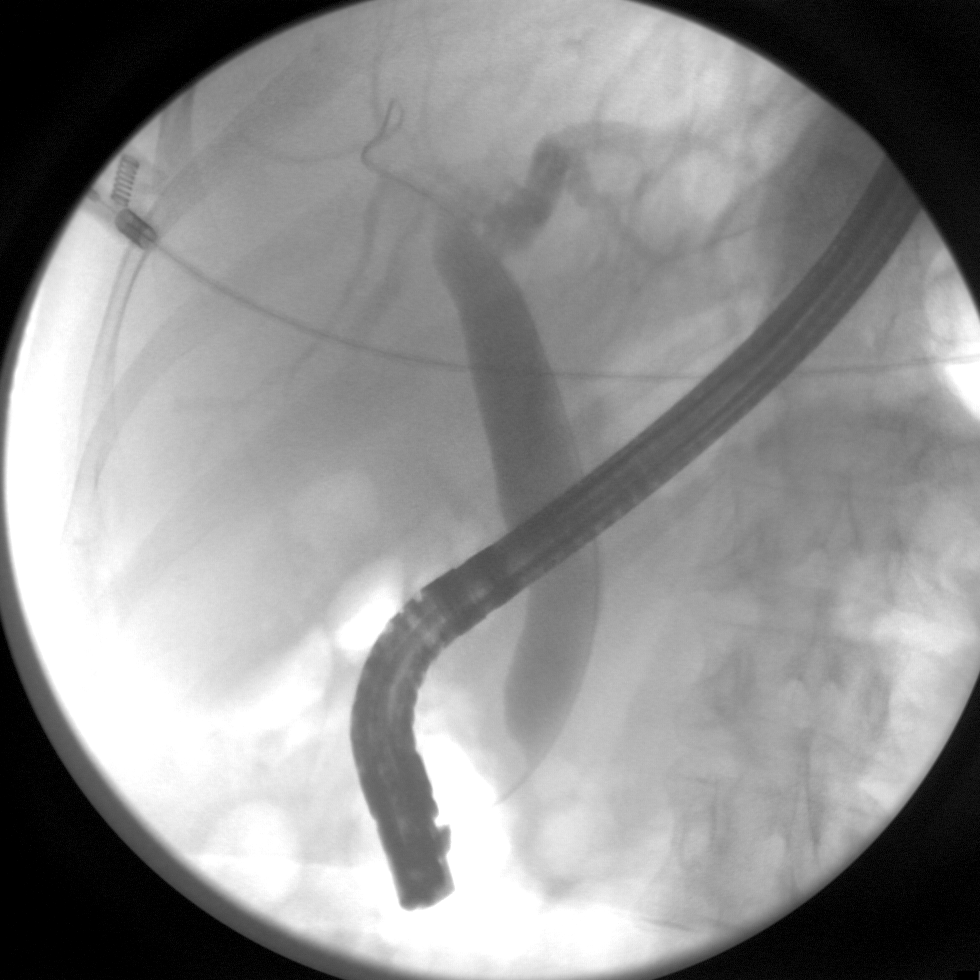
[im 4/4]
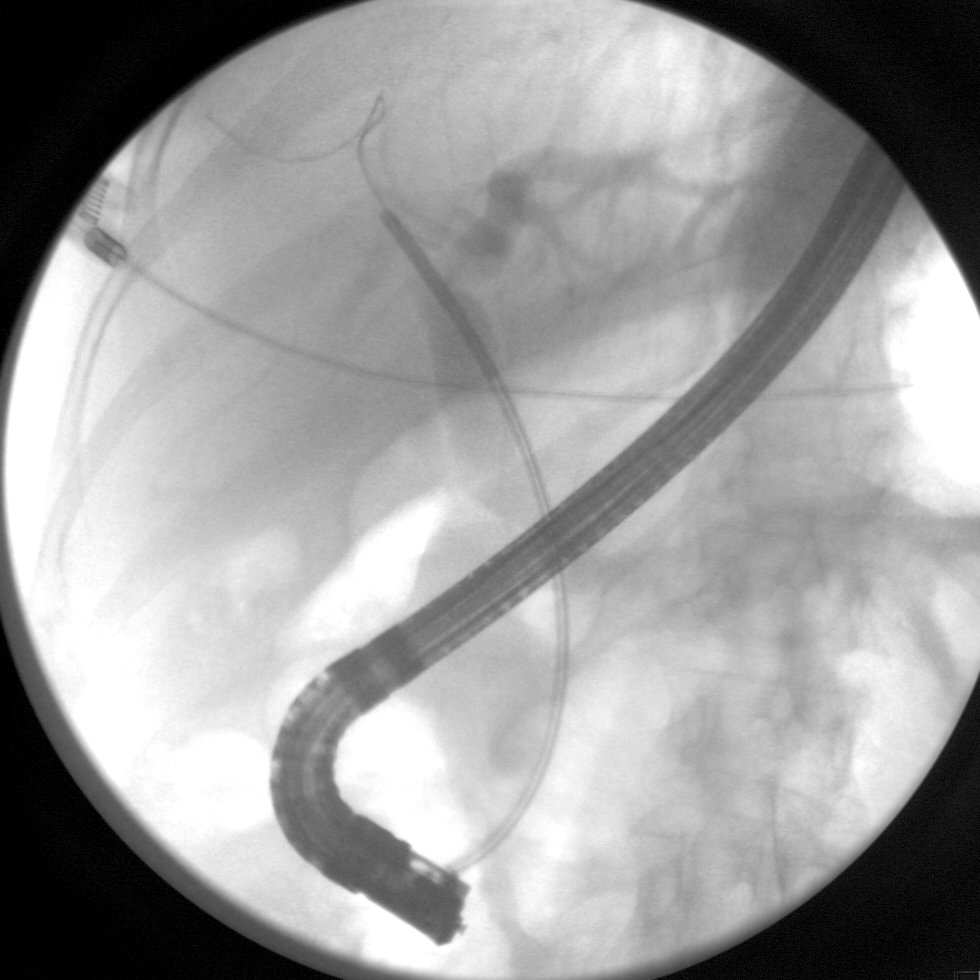

[4 of 4 positions shown; findings below may reference images not displayed]

FINDINGS: Four spot C-arm images of the right upper abdomen
demonstrate an endoscope in place as well as injected contrast in
the common duct and intrahepatic ducts as well as a guidewire.
These demonstrate dilatation of the common duct with improvement
since 10/13/2006.  There is also left hepatic duct dilatation, not
previously included.  No filling defects are seen.  No flow of
contrast into the duodenum is demonstrated.  Smooth tapering of the
distal common duct is unchanged.
IMPRESSION: 1.  Biliary ductal dilatation to the level of the distal common
duct with stable smooth tapering distally and no flow of contrast
into the duodenum.  This suggests a recurrent distal common duct
stricture or sphincter spasm.
2.  No intraductal calculi visualized.

## 2009-12-22 ENCOUNTER — Ambulatory Visit: Payer: Self-pay | Admitting: Pulmonary Disease

## 2010-05-04 ENCOUNTER — Telehealth (INDEPENDENT_AMBULATORY_CARE_PROVIDER_SITE_OTHER): Payer: Self-pay | Admitting: *Deleted

## 2010-05-19 ENCOUNTER — Ambulatory Visit: Payer: Self-pay

## 2010-05-19 HISTORY — PX: OTHER SURGICAL HISTORY: SHX169

## 2010-05-30 ENCOUNTER — Emergency Department (HOSPITAL_COMMUNITY): Admission: EM | Admit: 2010-05-30 | Discharge: 2010-05-30 | Payer: Self-pay | Admitting: Emergency Medicine

## 2010-06-01 ENCOUNTER — Telehealth (INDEPENDENT_AMBULATORY_CARE_PROVIDER_SITE_OTHER): Payer: Self-pay | Admitting: *Deleted

## 2010-06-02 ENCOUNTER — Ambulatory Visit (HOSPITAL_COMMUNITY): Admission: RE | Admit: 2010-06-02 | Discharge: 2010-06-03 | Payer: Self-pay | Admitting: Neurosurgery

## 2010-06-07 ENCOUNTER — Emergency Department (HOSPITAL_COMMUNITY): Admission: EM | Admit: 2010-06-07 | Discharge: 2010-06-07 | Payer: Self-pay | Admitting: Emergency Medicine

## 2010-06-22 ENCOUNTER — Ambulatory Visit: Payer: Self-pay | Admitting: Pulmonary Disease

## 2010-06-22 DIAGNOSIS — B029 Zoster without complications: Secondary | ICD-10-CM | POA: Insufficient documentation

## 2010-07-01 ENCOUNTER — Telehealth (INDEPENDENT_AMBULATORY_CARE_PROVIDER_SITE_OTHER): Payer: Self-pay | Admitting: *Deleted

## 2010-07-02 ENCOUNTER — Telehealth: Payer: Self-pay | Admitting: Pulmonary Disease

## 2010-07-03 ENCOUNTER — Telehealth: Payer: Self-pay | Admitting: Pulmonary Disease

## 2010-07-06 ENCOUNTER — Observation Stay (HOSPITAL_COMMUNITY)
Admission: EM | Admit: 2010-07-06 | Discharge: 2010-07-07 | Payer: Self-pay | Source: Home / Self Care | Attending: Internal Medicine | Admitting: Internal Medicine

## 2010-07-06 ENCOUNTER — Encounter: Payer: Self-pay | Admitting: Pulmonary Disease

## 2010-07-07 ENCOUNTER — Encounter (INDEPENDENT_AMBULATORY_CARE_PROVIDER_SITE_OTHER): Payer: Self-pay | Admitting: Internal Medicine

## 2010-07-09 ENCOUNTER — Encounter: Payer: Self-pay | Admitting: Pulmonary Disease

## 2010-07-14 ENCOUNTER — Ambulatory Visit: Payer: Self-pay | Admitting: Pulmonary Disease

## 2010-07-16 ENCOUNTER — Emergency Department (HOSPITAL_COMMUNITY)
Admission: EM | Admit: 2010-07-16 | Discharge: 2010-07-17 | Payer: Self-pay | Source: Home / Self Care | Admitting: Emergency Medicine

## 2010-08-09 ENCOUNTER — Encounter: Payer: Self-pay | Admitting: Gynecology

## 2010-08-09 ENCOUNTER — Encounter: Payer: Self-pay | Admitting: Pulmonary Disease

## 2010-08-18 NOTE — Progress Notes (Signed)
Summary: refill on Clorazepate  Phone Note Call from Patient Call back at Home Phone [phone]   Caller: Patient Call For: nadel Summary of Call: needs her tranzene filled and sent to [facility] [place] Initial call taken by: Lacinda Axon,  May 04, 2010 12:45 PM  Follow-up for Phone Call        called and spoke with pharmacist at [facility].  was informed last time pt had rx filled for this med was 04/04/2010.   script was originally signed on 12/22/2009.  pt has no refills remaining.  therefore gave verbal ok for # 90 x 3 refills.  called and spoke with pt.  pt aware rx sent to pharmacy.  Aundra Millet Reynolds LPN  May 04, 2010 1:52 PM     Prescriptions: CLORAZEPATE DIPOTASSIUM 7.5 MG TABS (CLORAZEPATE DIPOTASSIUM) take 1 tab by mouth three times a day as needed for nerves...  #90 x 3   Entered by:   Arman Filter LPN   Authorized by:   Michele Mcalpine MD   Signed by:   Arman Filter LPN on 03/47/4259   Method used:   Telephoned to ...       [facility]* (retail)       [address]       [place], [place]  [address]       Ph: [phone]       Fax: [phone]   RxID:   [id number]

## 2010-08-18 NOTE — Assessment & Plan Note (Signed)
Summary: 6 months rov/ fast labs//kp   Primary Care Provider:  Alroy Dust, MD  CC:  6 month ROV & review of mult medical problems....  History of Present Illness: 60 y/o WF here for a follow up visit... she has mult med issues as noted below... she has been unable to quit smoking despite all our prev conversations & recommendations...   ~  December 22, 2009:  27mo follow up & not feeling well, she says... wonders if its the heat... 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 she is still smoking  ~1/2ppd> min cough/ phlegm, etc... last CXR11/10= NAD... she has IC followed by Urology in [place]... persistant FM symptoms> on Amitrip, Vicodin, Tramadol... mod anxiety on Gertie Gowda but wants to ch back to [employer]... OK TDAP today.   ~  June 22, 2010:  6 mo ROV- notes painful rash on upper chest & back on left side c/w shingles x 2d... c/o severe pain- not relieved by Vicodin & Tramadol, lying in fetal position on exam table today... we discussed Rx w/ FAMVIR 500mg Tid, Depo80/ Dosepak, & Duragesic-50...  she just has CSpine surg (ant cerv disckectomy) by DrKritzer 06/02/10...  still smoking 1/2 ppd & requesting smoking cessation help- Rx for Chantix written... due for yearly CXR & blood work but wants to wait due to severe shingles pain... GI followed by DrKaplan & stable IBS symptoms...  IC still followed by Urology in [place]... FM & back pain> she wants Vicodin & Tramadol refilled, & asking to switch the Clorazepate to Ativan for nerves...   Current Problem List:  IRITIS (ICD-364.3) - see EMR note of 08/30/07 > Iritis followed by DrCohen of [facility]... we did a number of tests in 2009- collagen vasc screen was normal... pt reports she has early cataracts as well and is being  followed...  CIGARETTE SMOKER (ICD-305.1) - still smoking 1/2 ppd w/ no interest in quitting...  we discussed smoking cessation strategies, she refuses help...  Hx of BRONCHITIS (ICD-490) - hx recurrent bronchitic infections w/ cough, congestion, sputum production etc... advised to quit smoking, use Mucinex, Fluids, etc... she denies recent infections etc...  ~  CXR 11/10 was clear, NAD...  GERD (ICD-530.81) - uses Prn Prilosec...  IRRITABLE BOWEL SYNDROME & DIVERTICULOSIS OF COLON - we discussed trying Activa/ Align/ etc... using BENTYL 20mg  Prn...  ~  colonoscopy 5/99 by Dorris Singh showed few divertics... due for f/u colon.  ~  she had CTAbd 3/08 w/ diverticulosis seen...  ~  colonoscopy for rectal bleeding 9/10 showed +adenomatous polyp removed, divertics and hems (bleeding likely from hems) & Rx w/ Anusol-HC suppos...  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 she had ERCP w/ sphincterotomy and stent ~ all benign... no signs of pancreatitis... mod post ERCP pain that was persistant untl the stent was removed... she has PHENERGAN for nausea Prn.  ~  she had ERCP & AbdSonar by Dorris Singh 7/10 w/ hx papillary stenosis & stent for sludge (LFTs were norm)- stone fragments seen, dilated ducts, no obstructing lesions seen...  ~  11/10: labs showed AlkPhos= 181, SGOT= 52, SGPT= 53... sent to GIi to  review.  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  ~  Cysto 7/10- for hematuria & bladder symptoms- mild chr cystitis... on Pyridium.  BACK PAIN, LUMBAR (ICD-724.2) - she takes VICODIN Prn, and TRAMADOL Prn w/ control of her back pain and generalized muscle pain...  FIBROMYALGIA (ICD-729.1) - full eval in 12/15/01  by DrDeveshwar... still taking AMITRIPYLINE 75mg HS.  HEADACHE (ICD-784.0) - prev seen by Neuro, DrSchmidt...  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 she has alternated requests for CHLORAZEPATE 7.5mg  Tid & LORAZEPAM 1mg  Tid.    Current Medications (verified): 1)  Bentyl 20 Mg Tabs (Dicyclomine Hcl) .... Take 1 Tab By Mouth Up To 3 Times Daily As Needed For Abd Cramping 2)  Vicodin 5-500 Mg  Tabs (Hydrocodone-Acetaminophen) .[name].. 1 Tab By Mouth Three Times A Day As Needed For Severe Pain.Marland KitchenMarland Kitchen 3)  Ultram 50 Mg  Tabs (Tramadol Hcl) .... Take 1 Tablet By Mouth Three Times A Day As Needed 4)  Amitriptyline Hcl 25 Mg  Tabs (Amitriptyline Hcl) .... Take 3 Tablets At Bedtime 5)  Clorazepate Dipotassium 7.5 Mg Tabs (Clorazepate Dipotassium) .... Take 1 Tab By Mouth Three Times A Day As Needed For Nerves...  Allergies (verified): 1)  ! Compazine 2)  ! Reglan  Comments:  Nurse/Medical Assistant: The patient's medications and allergies were reviewed with the patient and were updated in the Medication and Allergy Lists.  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 Mother died age 26 w/ fall, broken hip; hx heart disease & pacer. Father alive age 82 w/  ASHD, s/p CABG, carotid cerebrovasc disease. 1 Sib- Bro age 54 w/ ASHD Family History of Colon Cancer: ? Paternal Uncle   Social History: Reviewed history from 01/29/2009 and no changes required. Divorced lives in [place] with daughter, and grand daughter smokes 10-12cigs a day No  Etoh Daily Caffeine Use tea and coffee Illicit Drug Use - no Patient gets regular exercise.  Review of Systems      See HPI       The patient complains of chest pain and dyspnea on exertion.  The patient denies anorexia, fever, weight loss, weight gain, vision loss, decreased hearing, hoarseness, syncope, peripheral edema, prolonged cough, headaches, hemoptysis, abdominal pain, melena, hematochezia, severe indigestion/heartburn, hematuria, incontinence, muscle weakness, suspicious skin lesions, transient blindness, difficulty walking, depression, unusual weight change, abnormal bleeding, enlarged lymph nodes, and angioedema.    Vital Signs:  Patient profile:   60 year old female Height:      66 inches Weight:      160.38 pounds BMI:     25.98 O2 Sat:      96 % on Room air Temp:     97 degrees F oral Pulse rate:   109 / minute BP sitting:   100 / 66  (right arm) Cuff size:   regular  Vitals Entered By: Abigail Miyamoto RN (June 22, 2010 9:04 AM)  O2 Flow:  Room air  Physical Exam  Additional Exam:  WD, WN, 60 y/o WF in NAD... she is chr ill appearing... GENERAL:  Alert & oriented; pleasant & cooperative... HEENT:  [place]/AT, EOM-full, PERRLA, EACs-clear, TMs-wnl, NOSE-clear, THROAT-clear & wnl. NECK:  Supple w/ fairROM; no JVD; normal carotid impulses w/o bruits; no thyromegaly or nodules palpated; no lymphadenopathy. CHEST:  Clear to P & A; without wheezes/ rales/ or rhonchi heard... Rash c/w shingles left chest wall  ~ T3-4 distrib. HEART:  Regular Rhythm; without murmurs/ rubs/ or gallops detected... ABDOMEN:  Soft & nontender no guarding or rebound, BS +x4Q, neg cva tenderness, no masses  palp... EXT:  without deformities,  no varicose veins/ venous insuffic/ or edema. NEURO:  CN's intact;  no focal neuro deficits... DERM:  No lesions noted; no rash etc...    MISC. Report  Procedure date:  06/22/2010  Findings:      DATA REVIEWED:  ~  prev EMR notes etc...   Impression & Recommendations:  Problem # 1:  SHINGLES (ICD-053.9) She has mod severe left T3-4 shingles...  we discussed Rx w/ FAMVIR, Depo80/ Pred Dosepak/ & add Duragesic-50 patch Q3d to her pain Rx w/ Vicodin/ Ultram... plan ROV in 3 weeks for recheck. Orders: Depo- Medrol 80mg  (J1040) Admin of Therapeutic Inj  intramuscular or subcutaneous (16[address])  Problem # 2:  CIGARETTE SMOKER (ICD-305.1) She must quit all smoking> discussed again w/ pt, offered Chantix, she declines... Her updated medication list for this problem includes:    Chantix Starting Month Pak 0.5 Mg X 11 & 1 Mg X 42 Tabs (Varenicline tartrate) ..... Use as directed...  Problem # 3:  IRRITABLE BOWEL SYNDROME (ICD-564.1) GI is stable>  continue Rx.  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  Problem # 5:  FIBROMYALGIA (ICD-729.1) Aware>  overall stable, continue meds. Her updated medication list for this problem includes:    Vicodin 5-500 Mg Tabs (Hydrocodone-acetaminophen) .[name]... 1 tab by mouth three times a day as needed for severe pain...    Ultram 50 Mg Tabs (Tramadol hcl) .[name]... Take 1 tablet by mouth three times a day as needed    Fentanyl 50 Mcg/hr Pt72 (Fentanyl) .[name]... Apply one patch every 3d...  Problem # 6:  OTHER MEDICAL ISSUES AS NOTED>>>  Complete Medication List: 1)  Bentyl 20 Mg Tabs (Dicyclomine hcl) .... Take 1 tab by mouth up to 3 times daily as needed for abd cramping 2)  Vicodin 5-500 Mg Tabs (Hydrocodone-acetaminophen) .[name].. 1 tab by mouth three times a day as needed for severe pain.Marland KitchenMarland Kitchen 3)  Ultram 50 Mg Tabs (Tramadol hcl) .... Take 1 tablet by mouth  three times a day as needed 4)  Amitriptyline Hcl 25 Mg Tabs (Amitriptyline hcl) .... Take 3 tablets at bedtime 5)  Lorazepam 1 Mg Tabs (Lorazepam) .... Take 1 tab by mouth three times a day as needed for nerves... 6)  Famciclovir 500 Mg Tabs (Famciclovir) .... Take 1 tab by mouth three times a day for shingles.Marland KitchenMarland Kitchen 7)  Prednisone (pak) 10 Mg Tabs (Prednisone) .... Take as directed... 8)  Fentanyl 50 Mcg/hr Pt72 (Fentanyl) .... Apply one patch every 3d... 9)  Chantix Starting Month Pak 0.5 Mg X 11 & 1 Mg X 42 Tabs (Varenicline tartrate) .... Use as directed...  Patient Instructions: 1)  Today we updated your med list-  see below.... 2)  For your Shingles:  we have perscribed FAMVIR to take 3 times daily for 7d, along w/ the Prednisone Dosepak as directed... 3)  You may use the Vicodin & Tramadol & Tylenol as before, but we wrote for a srtong narcotic pain patch to wear for 3d the change it every 3rd day... 4)  Keep the area clean w/ mild soap & water... 5)  We reilled your meds per request... 6)  We wrote a perscription for Chantix per your request... 7)  Let's plan a follow up visit in 3 weeks w/ FASTING blood work & a CXR... Prescriptions: CHANTIX STARTING MONTH PAK 0.5 MG X 11 & 1 MG X 42 TABS (VARENICLINE TARTRATE) use as directed...  #1 start pack x prn   Entered and Authorized by:   Michele Mcalpine MD   Signed by:   Michele Mcalpine MD on 06/22/2010   Method used:   Print then Give to Patient   RxID:   [id number] FENTANYL 50 MCG/HR PT72 (FENTANYL) apply one patch every 3d...  #2 boxes x 0   Entered and Authorized by:   Michele Mcalpine MD   Signed by:   Michele Mcalpine MD on 06/22/2010   Method used:   Print then Give to Patient   RxID:   [id number] PREDNISONE (PAK) 10 MG TABS (PREDNISONE) take as directed...  #10mg  6d pack x 0   Entered and Authorized by:   Michele Mcalpine MD   Signed by:   Michele Mcalpine MD on 06/22/2010   Method used:   Print then Give to Patient   RxID:    [id number] FAMCICLOVIR 500 MG TABS (FAMCICLOVIR) take 1 tab by mouth three times a day for shingles...  #21 x 0   Entered and Authorized by:   Michele Mcalpine MD   Signed by:   Michele Mcalpine MD on 06/22/2010   Method used:   Print then Give to Patient   RxID:   [id number] LORAZEPAM 1 MG TABS (LORAZEPAM) take 1 tab by mouth three times a day as needed for nerves...  #90 x 6   Entered and Authorized by:   Michele Mcalpine MD   Signed by:   Michele Mcalpine MD on 06/22/2010   Method used:   Print then Give to Patient   RxID:   [id number] AMITRIPTYLINE HCL 25 MG  TABS (AMITRIPTYLINE HCL) take 3 tablets at bedtime  #90 x 6   Entered and Authorized by:   Michele Mcalpine MD   Signed by:   Michele Mcalpine MD on 06/22/2010   Method used:   Print then Give to Patient   RxID:   [id number] ULTRAM 50 MG  TABS (TRAMADOL HCL) Take 1 tablet by mouth three times a day as needed  #90 x 6   Entered and Authorized by:   Michele Mcalpine MD   Signed by:   Michele Mcalpine MD on 06/22/2010   Method used:   Print then Give to Patient   RxID:   [id number] VICODIN 5-500 MG  TABS (HYDROCODONE-ACETAMINOPHEN) 1 tab by mouth three times a day as needed for severe pain...  #90 x 6   Entered and Authorized by:   Michele Mcalpine MD   Signed by:   Michele Mcalpine MD on 06/22/2010   Method used:   Print then Give to Patient   RxID:   [id number] BENTYL 20 MG TABS (DICYCLOMINE  HCL) take 1 tab by mouth up to 3 times daily as needed for abd cramping  #90 x 6   Entered and Authorized by:   Michele Mcalpine MD   Signed by:   Michele Mcalpine MD on 06/22/2010   Method used:   Print then Give to Patient   RxID:   [id number]    Medication Administration  Injection # 1:    Medication: Depo- Medrol 80mg     Diagnosis: SHINGLES (ICD-053.9)    Route: IM    Site: RUOQ gluteus    Exp Date: 11/2012    Lot #: [id number]    Mfr: [employer]    Patient tolerated injection without complications    Given by: Randell Loop CMA (June 22, 2010 10:27 AM)  Orders Added: 1)  Est. Patient Level IV [14782] 2)  Depo- Medrol 80mg  [J1040] 3)  Admin of Therapeutic Inj  intramuscular or subcutaneous [95621]

## 2010-08-18 NOTE — Letter (Signed)
Summary: Appt Reminder 2 for labs  [facility]  [address] [place], [place] [address]   Phone: [phone]  Fax: [phone]        July 22, 2009 MRN: [id number]    Diane Williams [address] [place], [place]  [address]    Dear Ms. Jenison,   You have follow up labs due. This is your second reminder. Please come   to our basement at [address] [facility].  If you have any questions or   concerns, please call [phone].    Sincerely,    Merri Ray CMA (AAMA)

## 2010-08-18 NOTE — Assessment & Plan Note (Signed)
Summary: 6 months/apc   Primary Care Provider:  Alroy Dust, MD  CC:  6 month ROV & review of mult medical problems....  History of Present Illness: 60 y/o WF here for a follow up visit... she has mult med issues as noted below... she has been unable to quit smoking despite all our prev conversations & recommendations...   ~  seen 2/09 w/ hx Iritis followed by DrCohen of [facility]... we did a number of tests that were requested- collagen vasc screen was normal... pt reports she has early cataracts as well and is being followed...  ~  she had a GYN eval from DrBerliner 6/09- note reviewed...   ~  May10:  states she had a "bug" w/ gastroent symptoms- N/V/D.Marland KitchenMarland Kitchen now better... also had UTI treated by her Urologist- DrCope in [place] & on Septra & Pyridium now...  ~  Nov10:  s/p extensive eval for GI complaints by [facility]- colonoscopy for rectal bleeding 9/10 showed +adenomatous polyp removed, divertics and hems (bleeding likely from hems) & Rx w/ Anusol-HC suppos...  followed for Urology by DrCope in [place]- Cysto 7/10- for hematuria & bladder symptoms- mild chr cystitis...  also had ERCP, AbdSonar by DrKaplan 7/10 w/ hx papillary stenosis & stent for sludge (LFTs were norm)- stone fragments seen, dilated ducts, no obstructing lesions seen...     ~  December 22, 2009:  59mo follow up & not feeling well, she says... wonders if its the heat... 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 she is still smoking  ~1/2ppd> min cough/ phlegm, etc... last CXR11/10= NAD... she has IC followed by Urology in [place]... persistant FM symptoms> on Amitrip, Vicodin, Tramadol... mod anxiety on Gertie Gowda but wants to ch back to [employer]... OK TDAP today.   Current Problem List:  IRITIS (ICD-364.3) -  see above and EMR note of 08/30/07...  CIGARETTE SMOKER (ICD-305.1) - still smoking 1/2 ppd w/ no interest in quitting...  ~  11/10: we discussed smoking cessation strategies, she refuses help...  ~  6/11:  again reviewed avail smoking cessation aides but she declines help...  Hx of BRONCHITIS (ICD-490) - hx recurrent bronchitic infections w/ cough, congestion, sputum production etc... advised to quit smoking, use Mucinex, Fluids, etc... she denies recent infections etc...  ~  CXR 11/10 was clear, NAD...  GERD (ICD-530.81) - uses Prn Prilosec...  IRRITABLE BOWEL SYNDROME & DIVERTICULOSIS OF COLON - she thinks her IBS was flaired by the gastroenteritis... we discussed trying Activa/ Align/ etc... using BENTYL 20mg  Prn...  ~  colonoscopy 5/99 by Dorris Singh showed few divertics... due for f/u colon.  ~  she had CTAbd 3/08 w/ diverticulosis seen...  ~  colonoscopy for rectal bleeding 9/10 showed +adenomatous polyp removed, divertics and hems (bleeding likely from hems) & Rx w/ Anusol-HC suppos...  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 she had ERCP w/ sphincterotomy and stent ~ all benign... no signs of pancreatitis... mod post ERCP pain that was persistant untl the stent was removed... she has PHENERGAN for nausea Prn.  ~  she had ERCP & AbdSonar by Dorris Singh 7/10 w/ hx papillary stenosis & stent  for sludge (LFTs were norm)- stone fragments seen, dilated ducts, no obstructing lesions seen...  ~  11/10: labs showed AlkPhos= 181, SGOT= 52, SGPT= 53... sent to GIi to review.  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  ~  Cysto 7/10- for hematuria & bladder symptoms- mild chr cystitis... on Pyridium.  BACK PAIN, LUMBAR  (ICD-724.2) - she takes VICODIN Prn, and TRAMADOL Prn w/ control of her back pain and generalized muscle pain...  FIBROMYALGIA (ICD-729.1) - full eval in 11-Dec-2001 by DrDeveshwar... still taking AMITRIPYLINE 75mg HS.  HEADACHE (ICD-784.0) - prev seen by Neuro, DrSchmidt...  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 she has alternated requests for CHLORAZEPATE 7.5mg  Tid & LORAZEPAM 1mg  Tid.   Preventive Screening-Counseling & Management  Alcohol-Tobacco     Smoking Status: current     Packs/Day: 6 cigs daily  Allergies: 1)  ! Compazine 2)  ! Reglan  Comments:  Nurse/Medical Assistant: The patient's medications and allergies were reviewed with the patient and were updated in the Medication and Allergy Lists.  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 Mother died age 41 w/ fall, broken hip; hx heart disease & pacer. Father alive age 63 w/ ASHD, s/p CABG, carotid cerebrovasc disease. 1 Sib- Bro age 38 w/ ASHD Family History of Colon Cancer: ? Paternal Uncle   Social History: Reviewed history from 01/29/2009 and no changes required. Divorced lives in [place] with daughter, and grand daughter smokes 10-12cigs a day No  Etoh Daily Caffeine Use tea and coffee Illicit Drug Use - no Patient gets regular  exercise. Packs/Day:  6 cigs daily  Review of Systems      See HPI       The patient complains of chest pain and dyspnea on exertion.  The patient denies anorexia, fever, weight loss, weight gain, vision loss, decreased hearing, hoarseness, syncope, peripheral edema, prolonged cough, headaches, hemoptysis, abdominal pain, melena, hematochezia, severe indigestion/heartburn, hematuria, incontinence, muscle weakness, suspicious skin lesions, transient blindness, difficulty walking, unusual weight change, abnormal bleeding, enlarged lymph nodes, and angioedema.    Vital Signs:  Patient profile:   60 year old female Height:      66 inches Weight:      167 pounds O2 Sat:      97 % on Room air Temp:     98.1 degrees F oral Pulse rate:   81 / minute BP sitting:   110 / 68  (right arm) Cuff size:   regular  Vitals Entered By: Randell Loop CMA (December 22, 2009 9:34 AM)  O2 Sat at Rest %:  97 O2 Flow:  Room air CC: 6 month ROV & review of mult medical problems... Is Patient Diabetic? No Pain Assessment Patient in pain? no      Comments meds updated today   Physical Exam  Additional Exam:  WD, WN, 60 y/o WF in NAD... she is chr ill appearing... GENERAL:  Alert & oriented; pleasant & cooperative... HEENT:  [place]/AT, EOM-full, PERRLA, EACs-clear, TMs-wnl, NOSE-clear, THROAT-clear & wnl. NECK:  Supple w/ fairROM; no JVD; normal carotid impulses w/o bruits; no thyromegaly or nodules palpated; no lymphadenopathy. CHEST:  Clear to P & A; without wheezes/ rales/ or rhonchi heard... HEART:  Regular Rhythm; without murmurs/ rubs/ or gallops detected... ABDOMEN:  Soft & nontender no guarding or rebound, BS +x4Q, neg cva tenderness, no masses palp... EXT:  without deformities,  no varicose veins/ venous insuffic/ or edema. NEURO:  CN's intact;  no focal neuro deficits... DERM:  No lesions noted; no rash etc...    Impression & Recommendations:  Problem # 1:  CIGARETTE SMOKER (ICD-305.1) We  reviewed the importance of smoking cessation but she refuses help.. No recent bronchitic exac etc...  Problem # 2:  GI >>> Followed by Dorris Singh w/ hx GERD, Divertics, IBS, & Biliary track disease... continue Rx & f/u w/ GI....  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   Problem # 4:  FIBROMYALGIA (ICD-729.1) She has FM, LBP, etc >> on meds listed + Amitrip 75mg  Qhs... Her updated medication list for this problem includes:    Vicodin 5-500 Mg Tabs (Hydrocodone-acetaminophen) .[name]... 1 tab by mouth three times a day as needed for severe pain...    Ultram 50 Mg Tabs (Tramadol hcl) .[name]... 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  Problem # 6:  OTHER MEDICAL PROBLEMS AS NOTED>>>  Complete Medication List: 1)  Bentyl 20 Mg Tabs (Dicyclomine hcl) .... Take 1 tab by mouth up to 3 times daily as needed for abd cramping 2)  Vicodin 5-500 Mg Tabs (Hydrocodone-acetaminophen) .[name].. 1 tab by mouth three times a day as needed for severe pain.Marland KitchenMarland Kitchen 3)  Ultram 50 Mg Tabs (Tramadol hcl) .... Take 1 tablet by mouth three times a day as needed 4)  Amitriptyline Hcl 25 Mg Tabs (Amitriptyline hcl) .... Take 3 tablets at bedtime 5)  Clorazepate Dipotassium 7.5 Mg Tabs (Clorazepate dipotassium) .... Take 1 tab by mouth three times a day as needed for nerves...  Other Orders: Tdap => 43yrs IM (16109) Admin 1st Vaccine (60454)  Patient Instructions: 1)  Today we updated your med list- see below.... 2)  We refilled your meds per request... 3)  We gave you the combination Tetanus vaccine called the TDAP today (good for 10yrs)... 4)  Call for any questions.Marland KitchenMarland Kitchen 5)  Please schedule a follow-up appointment in 6 months, with FASTING blood work at that time... Prescriptions: CLORAZEPATE DIPOTASSIUM 7.5 MG TABS (CLORAZEPATE DIPOTASSIUM) take 1 tab by mouth three times a day as  needed for nerves...  #90 x 5   Entered and Authorized by:   Michele Mcalpine MD   Signed by:   Michele Mcalpine MD on 12/22/2009   Method used:   Print then Give to Patient   RxID:   [id number] ULTRAM 50 MG  TABS (TRAMADOL HCL) Take 1 tablet by mouth three times a day as needed  #90 x 5   Entered and Authorized by:   Michele Mcalpine MD   Signed by:   Michele Mcalpine MD on 12/22/2009   Method used:   Print then Give to Patient   RxID:   [id number] VICODIN 5-500 MG  TABS (HYDROCODONE-ACETAMINOPHEN) 1 tab by mouth three times a day as needed for severe pain...  #90  x 5   Entered and Authorized by:   Michele Mcalpine MD   Signed by:   Michele Mcalpine MD on 12/22/2009   Method used:   Print then Give to Patient   RxID:   [id number]    Immunizations Administered:  Tetanus Vaccine:    Vaccine Type: Tdap    Site: left deltoid    Mfr: boostrix    Dose: 0.5 ml    Route: IM    Given by: Randell Loop CMA    Exp. Date: 10/11/2011    Lot #: [id number]    VIS given: 06/06/07 version given December 22, 2009.

## 2010-08-18 NOTE — Progress Notes (Signed)
Summary: urine problem  Phone Note Call from Patient   Caller: Patient Call For: Madelena Maturin Summary of Call: pt think she may have urine problems would like order put in for tomorrow when she come in to have labs done for liver Initial call taken by: Rickard Patience,  July 22, 2009 3:57 PM  Follow-up for Phone Call        called and spoke with pt.  pt states she is coming in tomorrow to have bloodwork drawn per Dr. Arlyce Dice.  Pt states while she is here she would like to leave urine sample.  Pt believes she has a UTI.  Pt c/o burning while urinating, lower back hurts, fever of 100.0, headache, frequency and urgency.  symptoms started yesterday.  please advise.  thank you. Arman Filter LPN  July 22, 2009 4:23 PM   Additional Follow-up for Phone Call Additional follow up Details #1::        ok for pt to have ua with c&s done when she come in for her labs.  thanks Randell Loop CMA  July 23, 2009 10:37 AM   pt advised okay to have ua and c/s when she has other labs drawn. order placed. Carron Curie CMA  July 23, 2009 10:42 AM

## 2010-08-18 NOTE — Progress Notes (Signed)
Summary: Tramadol rx  Phone Note Call from Patient Call back at [phone]   Caller: Patient Call For: nadel Summary of Call: pt would like tramadol refill [facility] Initial call taken by: Rickard Patience,  June 01, 2010 9:23 AM  Follow-up for Phone Call        Rx sent - pt aware.  Gweneth Dimitri RN  June 01, 2010 10:25 AM     Prescriptions: ULTRAM 50 MG  TABS (TRAMADOL HCL) Take 1 tablet by mouth three times a day as needed  #90 x 0   Entered by:   Gweneth Dimitri RN   Authorized by:   Michele Mcalpine MD   Signed by:   Gweneth Dimitri RN on 06/01/2010   Method used:   Electronically to        [facility]* (retail)       [address]       [place], [place]  [address]       Ph: [phone]       Fax: [phone]   RxID:   [id number]

## 2010-08-20 ENCOUNTER — Encounter: Payer: Self-pay | Admitting: Pulmonary Disease

## 2010-08-20 ENCOUNTER — Ambulatory Visit (INDEPENDENT_AMBULATORY_CARE_PROVIDER_SITE_OTHER): Payer: Medicare Other | Admitting: Pulmonary Disease

## 2010-08-20 DIAGNOSIS — J40 Bronchitis, not specified as acute or chronic: Secondary | ICD-10-CM

## 2010-08-20 DIAGNOSIS — K219 Gastro-esophageal reflux disease without esophagitis: Secondary | ICD-10-CM

## 2010-08-20 DIAGNOSIS — K589 Irritable bowel syndrome without diarrhea: Secondary | ICD-10-CM

## 2010-08-20 DIAGNOSIS — B029 Zoster without complications: Secondary | ICD-10-CM

## 2010-08-20 DIAGNOSIS — B0229 Other postherpetic nervous system involvement: Secondary | ICD-10-CM | POA: Insufficient documentation

## 2010-08-20 DIAGNOSIS — K5730 Diverticulosis of large intestine without perforation or abscess without bleeding: Secondary | ICD-10-CM

## 2010-08-20 DIAGNOSIS — F17200 Nicotine dependence, unspecified, uncomplicated: Secondary | ICD-10-CM

## 2010-08-20 DIAGNOSIS — N3010 Interstitial cystitis (chronic) without hematuria: Secondary | ICD-10-CM

## 2010-08-20 NOTE — Progress Notes (Signed)
Summary: shingles pain  Phone Note Call from Patient Call back at Home Phone [phone]   Caller: Patient Call For: nadel Summary of Call: pt states that she is in a great deal of pain (shingles) and that the rx she was given for this is "not touching it". no one from [facility] has called her either. [facility] [place] Initial call taken by: Tivis Ringer, CNA,  July 02, 2010 1:41 PM  Follow-up for Phone Call        Spoke with the pt and she states the gabapentin is nothelping the pain at all. She states she does not know how much of this she can take. She states thsi is the worse pain she has ever felt. Sh eis using vicodin and fentanyl patch as well. Referral was sent to neuro yesterday but pt has not received a call yet. Per [facility] this can take several days top get an appt. please advsie.Carron Curie CMA  July 02, 2010 2:52 PM  p[t called back and states appt with Neuro is not until 09-21-10. please advise. Carron Curie CMA  July 02, 2010 4:47 PM   Additional Follow-up for Phone Call Additional follow up Details #1::        per SN---needs ov asap with neurology or pain clinic---he is aware that her appt is in march and this appt is too far out---dx of post herpetic neuralgia...order has been put in again for this.  pt is aware that we will call her tomorrow with info on this Additional Follow-up by: Randell Loop CMA,  July 02, 2010 5:31 PM    Additional Follow-up for Phone Call Additional follow up Details #2::    see other phone note Randell Loop [facility]  July 03, 2010 10:00 AM

## 2010-08-20 NOTE — Progress Notes (Signed)
Summary: REFERRAL  Phone Note Call from Patient   Caller: Patient Summary of Call: PATIENT PHONED SHE STATED THAT SHE SPOKE TO A NURSE LAST NIGHT AND WAS WAITING ON A CALL FOR A REFERRAL. PLEASE CALL MS. Coggin SHE STATED THAT SHE DID NOT SLEEP AT ALL LAST NIGHT FEELS LIKE SHE IS ON FIRE. SHE CAN BE REACHED  AT [phone]. PATIENT STATED THAT IF SHE NEEDED TO SHE WOULD GO TO THE EMERGENCY ROOM/        Follow-up for Phone Call        Pt called again states she called [facility] and was told if SN called them she could be seen immediately pls advise.( I did not take previous message on this pt.) Follow-up by: Darletta Moll,  July 03, 2010 9:58 AM  Additional Follow-up for Phone Call Additional follow up Details #1::        SN is aware that he would need to do a doctor to doctor call--this will be done today.   thanks Randell Loop CMA  July 03, 2010 10:01 AM   pt advised that call will be made today. Carron Curie CMA  July 03, 2010 10:18 AM

## 2010-08-20 NOTE — Consult Note (Signed)
Summary: [facility]  [facility]   Imported By: Sherian Rein 07/23/2010 10:13:51  _____________________________________________________________________  External Attachment:    Type:   Image     Comment:   External Document

## 2010-08-20 NOTE — Letter (Signed)
Summary: Consult/[facility]  Consult/[facility]   Imported By: Sherian Rein 07/10/2010 08:40:26  _____________________________________________________________________  External Attachment:    Type:   Image     Comment:   External Document

## 2010-08-20 NOTE — Progress Notes (Signed)
Summary: shingles - start gabapentin, refer to neuro  Phone Note Call from Patient Call back at Home Phone [phone]   Caller: Patient Call For: nadel Reason for Call: Talk to Nurse Summary of Call: Patient was seen on 12/5 for shingles.  Patient calling today w/ complaints of fever, 101.0, shingles still very painful, shingles in chest area scabbing over now, but is hurting worse now than before.  Requesting nurse. Initial call taken by: Lehman Prom,  July 01, 2010 8:33 AM  Follow-up for Phone Call        Pt states she was seen on 06-22-10 and was diagnosed with shingles. She states she now has a fever of 101, and the pain is unbearable. SHe is using vicodin, tramadol and fentanyl patch for pain without relief. She states the pain is making her nauseous. The rash is scabbing over. Please advise. Carron Curie CMA  July 01, 2010 10:15 AM  [facility]  Additional Follow-up for Phone Call Additional follow up Details #1::        per SN---refer to neuro for post herpetic neuralgia---add gabapentin 100mg    #90  start 1 by mouth at bedtime and increasce slowly to 3 at bedtime ---cont on this med.  thanks Randell Loop CMA  July 01, 2010 10:32 AM     Additional Follow-up for Phone Call Additional follow up Details #2::    Called, spoke with pt.  She is aware of above recs per SN and verbalized understanding.  Also aware order placed for neuro and will receive another call re: appt date/time/location and gabapentin rx sent to [facility]. Follow-up by: Gweneth Dimitri RN,  July 01, 2010 11:08 AM  New/Updated Medications: GABAPENTIN 100 MG CAPS (GABAPENTIN) start 1 by mouth at bedtime and increasce slowly to 3 at bedtime Prescriptions: GABAPENTIN 100 MG CAPS (GABAPENTIN) start 1 by mouth at bedtime and increasce slowly to 3 at bedtime  #90 x 0   Entered by:   Gweneth Dimitri RN   Authorized by:   Michele Mcalpine MD   Signed by:   Gweneth Dimitri RN on 07/01/2010   Method  used:   Electronically to        [facility]* (retail)       [address]       [place], [place]  [address]       Ph: [phone]       Fax: [phone]   RxID:   [id number]

## 2010-09-09 NOTE — Assessment & Plan Note (Signed)
Summary: 3 wk f/u wth fasting labs/ pt here at 8:45   Vital Signs:  Patient profile:   60 year old female Height:      66 inches Weight:      165.13 pounds BMI:     26.75 O2 Sat:      97 % on room air Temp:     97.9 degrees F oral Pulse rate:   82 / minute BP sitting:   90 / 58  (left arm) Cuff size:   regular  Vitals Entered By: Randell Loop CMA (August 20, 2010 12:02 PM)  O2 Sat at Rest %:  97 O2 Flow:  room air CC: 2 month ROV & review... Is Patient Diabetic? No Pain Assessment Patient in pain? no      Comments meds updated today with pt   Primary Care Provider:  Alroy Dust, MD  CC:  2 month ROV & review....  History of Present Illness: 60 y/o WF here for a follow up visit... she has mult med issues as noted below... she has been unable to quit smoking despite all our prev conversations & recommendations...   ~  December 22, 2009:  77mo follow up & not feeling well, she says... wonders if its the heat... 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 she is still smoking  ~1/2ppd> min cough/ phlegm, etc... last CXR11/10= NAD... she has IC followed by Urology in [place]... persistant FM symptoms> on Amitrip, Vicodin, Tramadol... mod anxiety on Gertie Gowda but wants to ch back to [employer]... OK TDAP today.   ~  June 22, 2010:  6 mo ROV- notes painful rash on upper chest & back on left side c/w shingles x 2d... c/o severe pain- not relieved by Vicodin & Tramadol, lying in fetal position on exam table today... we discussed Rx w/ FAMVIR 500mg Tid, Depo80/ Dosepak, & Duragesic-50...  she just has CSpine surg (ant cerv disckectomy) by DrKritzer 06/02/10...  still smoking 1/2 ppd & requesting smoking cessation help- Rx for Chantix written... due for yearly CXR & blood work but  wants to wait due to severe shingles pain... GI followed by DrKaplan & stable IBS symptoms...  IC still followed by Urology in [place]... FM & back pain> she wants Vicodin & Tramadol refilled, & asking to switch the Clorazepate to Ativan for nerves...   ~  August 20, 2010:  she was hosp 12/11 by [facility] for [facility]CP> ruled out for cardiac prob & seen by DrNasher- disch on Percocet, Tramadol, Tylenol, Gabapentin, Amitriptyline... she continues to follow up w/ DrKritzer regarding her CSpine surg... still smoking  ~1/2 ppd & intol to Chantix she says... c/o URI congestion & persist itching from the shingles> we discussed ZPak & Atarax...   Current Problem List:  IRITIS (ICD-364.3) - see EMR note of 08/30/07 > Iritis followed by [facility]Cohen of [facility]... we did a number of tests in 2009- collagen vasc screen was normal... pt reports she has early cataracts as well and is being followed...  CIGARETTE SMOKER (ICD-305.1) - still smoking 1/2 ppd w/ no interest in quitting...  we discussed smoking cessation strategies, she tried Chantix but states intol w/ nausea...  Hx of BRONCHITIS (ICD-490) - hx recurrent bronchitic infections w/ cough, congestion, sputum production etc... advised to quit smoking, use  Mucinex, Fluids, etc... she denies recent infections etc...  ~  CXR 11/10 was clear, NAD.[name].  ~  CXR 12/11 was clear x sl incr basilar markings...   GERD (ICD-530.81) - uses Prn Prilosec...  IRRITABLE BOWEL SYNDROME & DIVERTICULOSIS OF COLON - we discussed trying Activa/ Align/ etc... using BENTYL 20mg  Prn...  ~  colonoscopy 1997-12-23 by Dorris Singh showed few divertics... due for f/u colon.  ~  she had CTAbd 3/08 w/ diverticulosis seen...  ~  colonoscopy for rectal bleeding 9/10 showed +adenomatous polyp removed, divertics and hems (bleeding likely from hems) & Rx w/ Anusol-HC suppos...  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 she had ERCP w/ sphincterotomy and stent ~ all benign... no signs of pancreatitis... mod post ERCP pain that was persistant untl the stent was removed... she has PHENERGAN for nausea Prn.  ~  she had ERCP & AbdSonar by Dorris Singh 7/10 w/ hx papillary stenosis & stent for sludge (LFTs were norm)- stone fragments seen, dilated ducts, no obstructing lesions seen...  ~  11/10: labs showed AlkPhos= 181, SGOT= 52, SGPT= 53... sent to GIi to review.  ~  f/u LFT's 1/11 were WNL.[name].  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  ~  Cysto 7/10- for hematuria & bladder symptoms- mild chr cystitis... on Pyridium.  BACK PAIN, LUMBAR (ICD-724.2) - she takes VICODIN Prn, and TRAMADOL Prn w/ control of her back pain and generalized muscle pain...  FIBROMYALGIA (ICD-729.1) - full eval in 2001-12-23 by DrDeveshwar... still taking AMITRIPYLINE 75mg HS.  HEADACHE (ICD-784.0) - prev seen by Neuro, DrSchmidt...  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 she has alternated requests for CHLORAZEPATE 7.5mg  Tid & LORAZEPAM 1mg  Tid.   Preventive Screening-Counseling & Management  Alcohol-Tobacco     Smoking Status: current     Packs/Day: 8 or 9  cigs daily     Year Started: December 24, 1998  Caffeine-Diet-Exercise     Does Patient Exercise: yes  Allergies: 1)  ! Compazine 2)  ! Reglan 3)  ! * Chantix  Comments:  Nurse/Medical Assistant: The patient's medications and allergies were reviewed with the patient and were updated in the Medication and Allergy Lists.  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 Mother died age 69 w/ fall, broken hip; hx heart disease & pacer. Father alive age 16 w/ ASHD, s/p CABG, carotid cerebrovasc disease. 1 Sib- Bro age 54 w/ ASHD Family History of Colon Cancer: ? Paternal Uncle   Social History: Reviewed history from 01/29/2009 and no changes required. Divorced lives in [place] with daughter, and grand daughter smokes 10-12cigs a day No  Etoh Daily Caffeine Use tea and coffee Illicit Drug Use - no  Patient gets regular exercise. Packs/Day:  8 or 9  cigs daily  Review of Systems      See HPI       The patient complains of chest pain and dyspnea on exertion.  The patient denies anorexia, fever, weight loss, weight gain, vision loss, decreased hearing, hoarseness, syncope, peripheral edema, prolonged cough, headaches, hemoptysis, abdominal pain, melena, hematochezia, severe indigestion/heartburn, hematuria, incontinence, muscle weakness, suspicious skin lesions, transient blindness, difficulty walking, depression, unusual weight change, abnormal bleeding, enlarged lymph nodes, and angioedema.    Physical Exam  Additional Exam:  WD, WN, 60 y/o WF in NAD... she is chr ill appearing... GENERAL:  Alert & oriented; pleasant & cooperative... HEENT:  [place]/AT, EOM-full, PERRLA, EACs-clear, TMs-wnl, NOSE-clear, THROAT-clear & wnl. NECK:  Supple w/ fairROM; no JVD; normal carotid impulses w/o bruits; no thyromegaly or  nodules palpated; no lymphadenopathy. CHEST:  Clear to P & A; without wheezes/ rales/ or rhonchi heard... Rash c/w shingles left chest wall  ~ T3-4 distrib. HEART:  Regular Rhythm; without murmurs/ rubs/ or gallops detected... ABDOMEN:  Soft & nontender no guarding or rebound, BS +x4Q, neg cva tenderness, no masses palp... EXT:  without deformities,  no varicose veins/ venous insuffic/ or edema. NEURO:  CN's intact;  no focal neuro deficits... DERM:  No lesions noted; no rash etc...    Impression & Recommendations:  Problem # 1:  ATYPICAL CP >>> Discomfort resolved but she really has a chr pain syndrome between her neck pain, back pain, FM, etc...  Problem # 2:  CIGARETTE SMOKER (ICD-305.1) We reviewed smoking cessation strategies... The following medications were removed from the medication list:    Chantix Starting Month Pak 0.5 Mg X 11 & 1 Mg X 42 Tabs (Varenicline tartrate) ..... Use as directed...  Problem # 3:  Hx of BRONCHITIS (ICD-490) Rx w/ ZPak for Prn use... Her updated medication list for this problem includes:    Zithromax Z-pak 250 Mg Tabs (Azithromycin) .[name]... Take as directed  Problem # 4:  IRRITABLE BOWEL SYNDROME (ICD-564.1) She has Bentyl for Prn use...  Problem # 5:  Hx of URINARY TRACT INFECTION (ICD-599.0) Hx chr UTI & IC symptoms... continue f/u w/ Urology. Her updated medication list for this problem includes:    Zithromax Z-pak 250 Mg Tabs (Azithromycin) .[name]... Take as directed  Problem # 6:  FIBROMYALGIA (ICD-729.1) She will continue pain meds>  offered refer to Rheum for their eval & rx... The following medications were removed from the medication list:    Fentanyl 50 Mcg/hr Pt72 (Fentanyl) .[name]... Apply one patch every 3d... Her updated medication list for this problem includes:    Vicodin 5-500 Mg Tabs (Hydrocodone-acetaminophen) .[name]... 1 tab by mouth three times a day as needed for severe pain...    Ultram 50 Mg Tabs (Tramadol hcl) .[name]... 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  Problem # 8:  OTHER MEDICAL PROBLEMS AS NOTED>>>  Complete Medication List: 1)  Bentyl 20 Mg Tabs (Dicyclomine hcl) .... Take 1 tab by mouth up to 3 times daily as needed for abd cramping 2)  Vicodin 5-500 Mg Tabs (Hydrocodone-acetaminophen) .[name].. 1 tab by mouth three times a day as needed for severe pain.Marland KitchenMarland Kitchen 3)  Ultram 50 Mg Tabs (Tramadol hcl) .... Take 1 tablet by mouth three times a day as needed 4)  Amitriptyline Hcl 25 Mg Tabs (Amitriptyline hcl) .... Take 3 tablets at  bedtime 5)  Lorazepam 1 Mg Tabs (Lorazepam) .... Take 1 tab by mouth three times a day as needed for nerves... 6)  Gabapentin 100 Mg Caps (Gabapentin) .... Start 1 by mouth at bedtime and increasce slowly to 3 at bedtime 7)  Hydroxyzine Hcl 25 Mg Tabs (Hydroxyzine hcl) .... Take 1 tab by mouth up to three times a day as needed for itching... 8)  Zithromax Z-pak 250 Mg Tabs (Azithromycin) .... Take as directed  Patient Instructions: 1)  Today we updated your med list- see below.... 2)  We wrote a new perscription for a ZPak (take as directed) and HYDROXYZINE 25mg - one tab three times daily for itching... 3)  Continue your other meds the same... 4)  Cut back further on the smoking... 5)  Let's plana follow up visit in 3 mo to check your progress... Prescriptions: ZITHROMAX Z-PAK 250 MG TABS (AZITHROMYCIN) take as directed  #1 x 0   Entered by:   Randell Loop CMA   Authorized by:   Michele Mcalpine MD   Signed by:   Randell Loop CMA on 08/20/2010   Method used:   Print then Give to Patient   RxID:   [id number] HYDROXYZINE HCL 25 MG TABS (HYDROXYZINE HCL) take 1 tab by mouth up to three times a day as needed for itching...  #90 x 6   Entered and Authorized by:   Michele Mcalpine MD   Signed by:   Michele Mcalpine MD on 08/20/2010   Method used:   Print then Give to Patient   RxID:    [id number]    Orders Added: 1)  Est. Patient Level IV [95284]   Immunization History:  Pneumovax Immunization History:    Pneumovax:  historical (07/06/2010)   Immunization History:  Pneumovax Immunization History:    Pneumovax:  Historical (07/06/2010)

## 2010-09-28 LAB — CARDIAC PANEL(CRET KIN+CKTOT+MB+TROPI)
CK, MB: 1.3 ng/mL (ref 0.3–4.0)
CK, MB: 4.3 ng/mL — ABNORMAL HIGH (ref 0.3–4.0)
Relative Index: 1.1 (ref 0.0–2.5)
Relative Index: 1.1 (ref 0.0–2.5)
Total CK: 400 U/L — ABNORMAL HIGH (ref 7–177)
Total CK: 94 U/L (ref 7–177)
Troponin I: 0.01 ng/mL (ref 0.00–0.06)

## 2010-09-28 LAB — DIFFERENTIAL
Basophils Absolute: 0 10*3/uL (ref 0.0–0.1)
Basophils Relative: 0 % (ref 0–1)
Basophils Relative: 1 % (ref 0–1)
Eosinophils Absolute: 0.1 10*3/uL (ref 0.0–0.7)
Eosinophils Absolute: 0.1 10*3/uL (ref 0.0–0.7)
Eosinophils Absolute: 0.2 10*3/uL (ref 0.0–0.7)
Eosinophils Relative: 1 % (ref 0–5)
Eosinophils Relative: 2 % (ref 0–5)
Lymphocytes Relative: 32 % (ref 12–46)
Lymphs Abs: 2.2 10*3/uL (ref 0.7–4.0)
Lymphs Abs: 2.4 10*3/uL (ref 0.7–4.0)
Lymphs Abs: 2.8 10*3/uL (ref 0.7–4.0)
Monocytes Absolute: 0.8 10*3/uL (ref 0.1–1.0)
Monocytes Absolute: 0.8 10*3/uL (ref 0.1–1.0)
Monocytes Relative: 10 % (ref 3–12)
Monocytes Relative: 14 % — ABNORMAL HIGH (ref 3–12)
Neutro Abs: 4.1 10*3/uL (ref 1.7–7.7)
Neutrophils Relative %: 54 % (ref 43–77)
Neutrophils Relative %: 55 % (ref 43–77)

## 2010-09-28 LAB — CBC
HCT: 40.3 % (ref 36.0–46.0)
Hemoglobin: 11.8 g/dL — ABNORMAL LOW (ref 12.0–15.0)
Hemoglobin: 12.8 g/dL (ref 12.0–15.0)
Hemoglobin: 13.4 g/dL (ref 12.0–15.0)
MCH: 30.3 pg (ref 26.0–34.0)
MCH: 30.6 pg (ref 26.0–34.0)
MCH: 31.3 pg (ref 26.0–34.0)
MCHC: 31.6 g/dL (ref 30.0–36.0)
MCV: 94.2 fL (ref 78.0–100.0)
MCV: 94.7 fL (ref 78.0–100.0)
Platelets: 273 10*3/uL (ref 150–400)
RBC: 4.18 MIL/uL (ref 3.87–5.11)
RBC: 4.28 MIL/uL (ref 3.87–5.11)
RDW: 13.8 % (ref 11.5–15.5)
RDW: 15.2 % (ref 11.5–15.5)
WBC: 7.6 10*3/uL (ref 4.0–10.5)

## 2010-09-28 LAB — COMPREHENSIVE METABOLIC PANEL
ALT: 19 U/L (ref 0–35)
ALT: 33 U/L (ref 0–35)
AST: 18 U/L (ref 0–37)
AST: 30 U/L (ref 0–37)
Albumin: 2.6 g/dL — ABNORMAL LOW (ref 3.5–5.2)
CO2: 27 mEq/L (ref 19–32)
Calcium: 8.5 mg/dL (ref 8.4–10.5)
Chloride: 104 mEq/L (ref 96–112)
Creatinine, Ser: 0.45 mg/dL (ref 0.4–1.2)
GFR calc Af Amer: >60 mL/min (ref >60)
GFR calc Af Amer: >60 mL/min (ref >60)
GFR calc non Af Amer: >60 mL/min (ref >60)
GFR calc non Af Amer: >60 mL/min (ref >60)
Glucose, Bld: 106 mg/dL — ABNORMAL HIGH (ref 70–99)
Sodium: 139 mEq/L (ref 135–145)
Sodium: 140 mEq/L (ref 135–145)
Total Bilirubin: 0.6 mg/dL (ref 0.3–1.2)
Total Protein: 5.5 g/dL — ABNORMAL LOW (ref 6.0–8.3)

## 2010-09-28 LAB — PHOSPHORUS: Phosphorus: 3.7 mg/dL (ref 2.3–4.6)

## 2010-09-28 LAB — POCT CARDIAC MARKERS
CKMB, poc: 1.3 ng/mL (ref 1.0–8.0)
Myoglobin, poc: 57.1 ng/mL (ref 12–200)
Troponin i, poc: <0.05 ng/mL (ref 0.00–0.09)

## 2010-09-28 LAB — TROPONIN I: Troponin I: 0.02 ng/mL (ref 0.00–0.06)

## 2010-09-28 LAB — LIPID PANEL
Cholesterol: 191 mg/dL (ref 0–200)
HDL: 53 mg/dL (ref >39)
Total CHOL/HDL Ratio: 3.6 RATIO
VLDL: 17 mg/dL (ref 0–40)

## 2010-09-28 LAB — D-DIMER, QUANTITATIVE: D-Dimer, Quant: 0.75 ug/mL-FEU — ABNORMAL HIGH (ref 0.00–0.48)

## 2010-09-28 LAB — BASIC METABOLIC PANEL
CO2: 27 mEq/L (ref 19–32)
Chloride: 105 mEq/L (ref 96–112)
Creatinine, Ser: 0.5 mg/dL (ref 0.4–1.2)
GFR calc Af Amer: >60 mL/min (ref >60)

## 2010-09-28 LAB — CK TOTAL AND CKMB (NOT AT ARMC)
Relative Index: 1.1 (ref 0.0–2.5)
Total CK: 190 U/L — ABNORMAL HIGH (ref 7–177)

## 2010-09-28 LAB — T4: T4, Total: 7.2 ug/dL (ref 5.0–12.5)

## 2010-09-28 LAB — TSH: TSH: 1.425 u[IU]/mL (ref 0.350–4.500)

## 2010-09-28 LAB — MRSA PCR SCREENING: MRSA by PCR: NEGATIVE

## 2010-09-29 LAB — URINALYSIS, ROUTINE W REFLEX MICROSCOPIC
Bilirubin Urine: NEGATIVE
Glucose, UA: NEGATIVE mg/dL
Hgb urine dipstick: NEGATIVE
Ketones, ur: NEGATIVE mg/dL
pH: 5.5 (ref 5.0–8.0)

## 2010-09-29 LAB — URINE MICROSCOPIC-ADD ON

## 2010-09-30 LAB — CBC
HCT: 46 % (ref 36.0–46.0)
Hemoglobin: 15.3 g/dL — ABNORMAL HIGH (ref 12.0–15.0)
RBC: 4.84 MIL/uL (ref 3.87–5.11)
RDW: 13.2 % (ref 11.5–15.5)
WBC: 10.4 10*3/uL (ref 4.0–10.5)

## 2010-09-30 LAB — SURGICAL PCR SCREEN: Staphylococcus aureus: POSITIVE — AB

## 2010-10-01 ENCOUNTER — Telehealth (INDEPENDENT_AMBULATORY_CARE_PROVIDER_SITE_OTHER): Payer: Self-pay | Admitting: *Deleted

## 2010-10-06 NOTE — Progress Notes (Signed)
Summary: burning sensation when urinates----rx for cipro  Phone Note Call from Patient Call back at Home Phone [phone]   Caller: Patient Call For: Diane Williams Summary of Call: Pt c/o of stomach pains, back pain headache since last night suspects she may h ave a uti pls advise.//[facility] Initial call taken by: Darletta Moll,  October 01, 2010 12:12 PM  Follow-up for Phone Call        Called and spoke with patient and she states she is having a burning sensation when she urinates, some lower back pain, headache, 101 fever since yesterday. Pt also states she is not able to empty out her bladder and is having to go to the bathroom every couple minutes. Pt states she thinks she may have a UTI. Dr. Kriste Basque please advise recommendation for patient. Thanks. [facility] Allergies:  1)  ! Compazine 2)  ! Reglan 3)  ! * Chantix Carver Fila  October 01, 2010 2:20 PM   Additional Follow-up for Phone Call Additional follow up Details #1::        per SN Cipro 250mg  Take 1 tablet by mouth three times a day # 14 x 0 refills.    rx sent to pharmacy.  pt aware.  Aundra Millet Reynolds LPN  October 01, 2010 5:39 PM     New/Updated Medications: CIPRO 250 MG TABS (CIPROFLOXACIN HCL) Take 1 tablet by mouth two times a day until gone. Prescriptions: CIPRO 250 MG TABS (CIPROFLOXACIN HCL) Take 1 tablet by mouth two times a day until gone.  #14 x 0   Entered by:   Arman Filter LPN   Authorized by:   Michele Mcalpine MD   Signed by:   Arman Filter LPN on 09/81/1914   Method used:   Electronically to        [facility]* (retail)       [address]       [place], [place]  [address]       Ph: [phone]       Fax: [phone]   RxID:   [id number]

## 2010-10-26 LAB — DIFFERENTIAL
Basophils Absolute: 0 10*3/uL (ref 0.0–0.1)
Eosinophils Absolute: 0.2 10*3/uL (ref 0.0–0.7)
Eosinophils Relative: 2 % (ref 0–5)
Lymphocytes Relative: 25 % (ref 12–46)
Lymphs Abs: 2.2 10*3/uL (ref 0.7–4.0)
Monocytes Absolute: 0.6 10*3/uL (ref 0.1–1.0)

## 2010-10-26 LAB — URINALYSIS, ROUTINE W REFLEX MICROSCOPIC
Bilirubin Urine: NEGATIVE
Glucose, UA: NEGATIVE mg/dL
Specific Gravity, Urine: 1.01 (ref 1.005–1.030)
Urobilinogen, UA: 0.2 mg/dL (ref 0.0–1.0)
pH: 7.5 (ref 5.0–8.0)

## 2010-10-26 LAB — CBC
MCV: 94.3 fL (ref 78.0–100.0)
Platelets: 264 10*3/uL (ref 150–400)
RBC: 4.42 MIL/uL (ref 3.87–5.11)
WBC: 8.6 10*3/uL (ref 4.0–10.5)

## 2010-10-26 LAB — URINE MICROSCOPIC-ADD ON

## 2010-10-26 LAB — COMPREHENSIVE METABOLIC PANEL
ALT: 19 U/L (ref 0–35)
AST: 19 U/L (ref 0–37)
Albumin: 3.8 g/dL (ref 3.5–5.2)
CO2: 28 mEq/L (ref 19–32)
Chloride: 108 mEq/L (ref 96–112)
Creatinine, Ser: 0.54 mg/dL (ref 0.4–1.2)
GFR calc Af Amer: >60 mL/min (ref >60)
GFR calc non Af Amer: >60 mL/min (ref >60)
Sodium: 144 mEq/L (ref 135–145)
Total Bilirubin: 0.5 mg/dL (ref 0.3–1.2)

## 2010-10-27 ENCOUNTER — Telehealth: Payer: Self-pay | Admitting: Pulmonary Disease

## 2010-10-27 NOTE — Telephone Encounter (Signed)
Called and spoke with pt and she stated that she is hurting all over, with a headache and her stress level is out the roof--she stated that she is about to lose her mind and felt like she needed to be seen right now.  i explained to her that she will need to go to the er for eval of the issues that she is having.  i also explained to her that if she feels like she needs BH eval then they can initiate that as well.  We will see her on Thursday and discuss her meds with her at that time.  Pt voiced her understanding of going to the er for eval and told pt she can either go to [place] [facility].  Pt  Voiced her understanding again of this.

## 2010-10-27 NOTE — Telephone Encounter (Signed)
[email or web address]  

## 2010-10-27 NOTE — Telephone Encounter (Signed)
[id number] 

## 2010-10-27 NOTE — Telephone Encounter (Addendum)
Patient states she feels like she is having a nervous breakdown. She says her stress level has increased due to family issues and taking care of her father. She was crying and said she doesn't know what to do anymore. She says the lorazepam is not helping at all. She did schedule an appointment to see SN on Thurs., 10/29/2010. Pls advise with recs in the meantime.  Pt's cell # is [phone]

## 2010-10-29 ENCOUNTER — Encounter: Payer: Self-pay | Admitting: Adult Health

## 2010-10-29 ENCOUNTER — Other Ambulatory Visit: Payer: Medicare Other

## 2010-10-29 ENCOUNTER — Ambulatory Visit (INDEPENDENT_AMBULATORY_CARE_PROVIDER_SITE_OTHER): Payer: Medicare Other | Admitting: Adult Health

## 2010-10-29 ENCOUNTER — Ambulatory Visit: Payer: Medicare Other | Admitting: Pulmonary Disease

## 2010-10-29 DIAGNOSIS — F3289 Other specified depressive episodes: Secondary | ICD-10-CM

## 2010-10-29 DIAGNOSIS — N3010 Interstitial cystitis (chronic) without hematuria: Secondary | ICD-10-CM

## 2010-10-29 DIAGNOSIS — F329 Major depressive disorder, single episode, unspecified: Secondary | ICD-10-CM

## 2010-10-29 DIAGNOSIS — F341 Dysthymic disorder: Secondary | ICD-10-CM

## 2010-10-29 MED ORDER — SERTRALINE HCL 50 MG PO TABS: 50.0000 mg | ORAL_TABLET | Freq: Every day | ORAL | Status: DC

## 2010-10-29 NOTE — Progress Notes (Signed)
Subjective:    Patient ID: Diane Williams, female    DOB: [birth year], 60 y.o.   MRN: [id number]  HPI 60 y/o WF here for a follow up visit... she has mult med issues as noted below... she has been unable to quit smoking despite all our prev conversations & recommendations...   ~ December 22, 2009: 48mo follow up & not feeling well, she says... wonders if its the heat... she had GI f/u DrKaplan 12/10 (f/u abn LFTs & hx papillary stenosis w/ sphincterotomy in past- last ERCP 7/10 w/ dilated ducts & stone fragments removed)> Sonar showed fatty liver, dilated ducts related to prev cholecystectomy; LFT's returned to normal... she is still smoking ~1/2ppd> min cough/ phlegm, etc... last CXR11/10= NAD... she has IC followed by Urology in [place]... persistant FM symptoms> on Amitrip, Vicodin, Tramadol... mod anxiety on Gertie Gowda but wants to ch back to [employer]... OK TDAP today.   ~ June 22, 2010: 6 mo ROV- notes painful rash on upper chest & back on left side c/w shingles x 2d... c/o severe pain- not relieved by Vicodin & Tramadol, lying in fetal position on exam table today... we discussed Rx w/ FAMVIR 500mg Tid, Depo80/ Dosepak, & Duragesic-50... she just has CSpine surg (ant cerv disckectomy) by DrKritzer 06/02/10... still smoking 1/2 ppd & requesting smoking cessation help- Rx for Chantix written... due for yearly CXR & blood work but wants to wait due to severe shingles pain... GI followed by DrKaplan & stable IBS symptoms... IC still followed by Urology in [place]... FM & back pain> she wants Vicodin & Tramadol refilled, & asking to switch the Clorazepate to Ativan for nerves...   ~ August 20, 2010: she was hosp 12/11 by [facility] for atypCP> ruled out for cardiac prob & seen by DrNasher- disch on Percocet, Tramadol, Tylenol, Gabapentin, Amitriptyline... she continues to follow up w/ DrKritzer regarding her CSpine surg... still smoking ~1/2 ppd & intol to Chantix she says... c/o URI congestion & persist itching  from the shingles> we discussed ZPak & Atarax...  10/29/2010  On disability for depression, and fibromyalgia. Has chronic neck pain w/ recent surgery in 05/2010 which seems she has been worse and stressed since surgery. Uses Tramadol and Vicodin As needed  Pain. Previous evaluation with Rheumotolgy on elavil At bedtime.  Has stress with ill father that she is having to deal with.  Feels like she has a UTI now with urinary urgency and dysuria. As hx of IC with followed by urology in [place].  Does not feel ativan is working wants to go on [employer]  Her son is with her today, she is now staying with him due to her stress.  SHe complains of increased stress, depression, crying. Denies suicidal/homicidial ideations.      Review of Systems Constitutional:   No  weight loss, night sweats,  Fevers, chills,    HEENT:   No   Difficulty swallowing,  Tooth/dental problems, or  Sore throat,                No sneezing, itching, ear ache, nasal congestion, post nasal drip,   CV:  No chest pain,  Orthopnea, PND, swelling in lower extremities, anasarca, dizziness, palpitations, syncope.   GI  No heartburn, indigestion, abdominal pain, nausea, vomiting, diarrhea, change in bowel habits, loss of appetite, bloody stools.   Resp: No shortness of breath with exertion or at rest.  No excess mucus, no productive cough,  No non-productive cough,  No coughing up of blood.  No change in color of mucus.  No wheezing.  No chest wall deformity  Skin: no rash or lesions.  GU: no dysuria, change in color of urine, no urgency or frequency.  No flank pain, no hematuria   MS:  No joint pain or swelling.  No decreased range of motion.  No back pain.  Psych:    No memory loss.    Objective:   Physical Exam GEN: A/Ox3; pleasant , NAD, anxious   HEENT:  [place]/AT,  EACs-clear, TMs-wnl, NOSE-clear, THROAT-clear, no lesions, no postnasal drip or exudate noted.   NECK:  Supple w/ fair ROM; no JVD; normal carotid  impulses w/o bruits; no thyromegaly or nodules palpated; no lymphadenopathy.  RESP  Clear  P & A; w/o, wheezes/ rales/ or rhonchi.no accessory muscle use, no dullness to percussion  CARD:  RRR, no m/r/g  , no peripheral edema, pulses intact, no cyanosis or clubbing.  GI:   Soft & nt; nml bowel sounds; no organomegaly or masses detected.neg cva tenderness   Musco: Warm bil, no deformities or joint swelling noted.   Neuro: alert, no focal deficits noted.    Skin: Warm, no lesions or rashes       Assessment & Plan:

## 2010-10-29 NOTE — Patient Instructions (Signed)
We are referring you to Psychiatrist and psychologist to discuss your depression and anxiety.  Begin zoloft 50mg  daily .  follow up in 6 weeks Dr. Kriste Basque   Stress reducers.  May change Ativan to Chlorazepate.  We are checking your urine sample for infection, it will take several days, we will call with results.  Please contact office for sooner follow up if symptoms do not improve or worsen or seek emergency care

## 2010-10-29 NOTE — Assessment & Plan Note (Addendum)
Check urine today w/ culture

## 2010-10-29 NOTE — Assessment & Plan Note (Signed)
dfdsf

## 2010-11-02 DIAGNOSIS — F329 Major depressive disorder, single episode, unspecified: Secondary | ICD-10-CM | POA: Insufficient documentation

## 2010-11-02 NOTE — Assessment & Plan Note (Signed)
Worsening anxiety and depression Plan:   We are referring you to Psychiatrist and psychologist to discuss your depression and anxiety.  Begin zoloft 50mg  daily .  follow up in 6 weeks Dr. Kriste Basque   Stress reducers.  May change Ativan to Chlorazepate.  Please contact office for sooner follow up if symptoms do not improve or worsen or seek emergency care

## 2010-11-03 ENCOUNTER — Telehealth: Payer: Self-pay | Admitting: *Deleted

## 2010-11-03 MED ORDER — CITALOPRAM HYDROBROMIDE 20 MG PO TABS: 20.0000 mg | ORAL_TABLET | Freq: Every day | ORAL | Status: DC

## 2010-11-03 NOTE — Telephone Encounter (Signed)
Please check with [facility] to see if order was done. They were suppose to get referral number  And make call themselves.  We can try another antidepressant if she would like  If so send celexa 20mg  daily #30, 5 refills.  Please contact office for sooner follow up if symptoms do not improve or worsen or seek emergency care   follow up as planned and As needed

## 2010-11-03 NOTE — Telephone Encounter (Signed)
I spoke with patient-she is aware of calling to make appt with psyh appt in the morning; also aware I called the Rx for Celexa to [facility] pharmacy. Pt also aware if things get worse she needs to seek medical attn at the ER.

## 2010-11-03 NOTE — Telephone Encounter (Signed)
Urine culture results received from [facility].  Per TP: no UTI, cont w/ ov recs.  Called spoke with patient, advised of lab results/recs as stated by TP.  Pt verbalized her understanding.  Pt does report that she was unable to take the zoloft prescribed to her at ov d/t itching and n/v.  Also has not heard from psychology/psychiatry referrals.  Will check on this and route to TP for recs on the zoloft.

## 2010-11-10 ENCOUNTER — Telehealth: Payer: Self-pay | Admitting: Pulmonary Disease

## 2010-11-10 NOTE — Telephone Encounter (Signed)
Spoke w/ pt and she states 2 of the 3 doctors names given to her for [facility] were not accepting medicaid pts'. Pt state Dr. Nolen Mu did but he is out for 10 weeks and pt states she can't wait that long. I spoke w/ Almyra Free for other names pt could try and we have her behavioral health #, edward rhoades and larry ray # for her to try. Pt states she will call them and see. Nothing further was needed at this time

## 2010-11-23 ENCOUNTER — Encounter: Payer: Self-pay | Admitting: Adult Health

## 2010-11-27 ENCOUNTER — Telehealth: Payer: Self-pay | Admitting: Pulmonary Disease

## 2010-11-27 MED ORDER — LORAZEPAM 1 MG PO TABS: ORAL_TABLET | ORAL | Status: DC

## 2010-11-27 MED ORDER — HYDROCODONE-ACETAMINOPHEN 5-500 MG PO TABS: ORAL_TABLET | ORAL | Status: DC

## 2010-11-27 NOTE — Telephone Encounter (Signed)
Pt requesting refill on her vicodin 5-500 which was last rx'd w/ #90 x 6 refills 06/22/10 and her ativan last rx'd 06/22/10 #90 x 6 refills. Pt states pharmacy advised her she had no refills left. Pt has upcoming apt 12/07/10. Please advise Dr. Kriste Basque if okay to refill. Thanks  Carver Fila, CMA

## 2010-11-27 NOTE — Telephone Encounter (Signed)
[facility] pharmacy and was on hold x 5 minutes. Left message on VM advising of rx for ativan and vicodin at [facility] pharmacy. Pt is aware rx's was called into pharmacy and needs to keep OV for further refills

## 2010-11-27 NOTE — Telephone Encounter (Signed)
Ok to refill for pt just once and she will need to keep her follow up appt.  thanks

## 2010-12-01 NOTE — Assessment & Plan Note (Signed)
[facility]                         GASTROENTEROLOGY OFFICE NOTE   NAME:CLAPPSherral, Dirocco                          MRN:          [id number]  DATE:01/09/2007                            DOB:          Jul 16, 1951    PROBLEM:  Abdominal pain.   REASON FOR VISIT:  Ms. Gulden has returned for re-evaluation.  Since  removing her biliary stent, she has been pain-free.  At ERCP, a large  amount of sludge was pulled from the bile duct following stent removal.  Ms. Clements has occasional right lower quadrant abdominal discomfort which  is relieved with a bowel movement.   PHYSICAL EXAMINATION:  VITAL SIGNS:  Pulse 72, blood pressure 116/80,  weight 172.   IMPRESSION:  Right upper quadrant, likely due to recurrent biliary  sludge, perhaps related to stasis or partial obstruction of the biliary  stent.  This has now resolved.   RECOMMENDATIONS:  No further GI workup.     Barbette Hair. Arlyce Dice, MD,FACG  Electronically Signed    RDK/MedQ  DD: 01/09/2007  DT: 01/09/2007  Job #: [id number]

## 2010-12-01 NOTE — Assessment & Plan Note (Signed)
[facility]                             PULMONARY OFFICE NOTE   NAME:Diane Williams, Diane Williams                          MRN:          [id number]  DATE:06/01/2007                            DOB:          16-Jan-1951    HISTORY OF PRESENT ILLNESS:  The patient is a 60 year old white female  patient of Dr. Jodelle Green who has a known history of bronchitis, reflux,  and low back pain.  The patient was recently seen in the office two  weeks ago for acute bronchitis, given Avelox and a Medrol Dosepak.  The  patient reports symptoms have not really improved, continues to have a  sore throat, bilateral ear pain and cough.  The patient denies any  hemoptysis, orthopnea, PND, or leg swelling.   PAST MEDICAL HISTORY:  Reviewed.   CURRENT MEDICATIONS:  Reviewed.   PHYSICAL EXAMINATION:  The patient is a pleasant female in no acute  distress.  She is afebrile with stable signs.  O2 saturation is 98% on room air.  HEENT:  Nasal mucosa has some mild erythema.  Nontender sinuses.  Posterior pharynx was with some mild erythema.  No exudate.  NECK:  Supple without adenopathy.  Negative nuchal rigidity.  LUNGS:  Lung sounds are clear.  CARDIAC:  Regular rate.  ABDOMEN:  Soft and nontender.  No palpable hepatosplenomegaly.  EXTREMITIES:  Warm without any edema.  SKIN:  Warm without rash.   IMPRESSION/PLAN:  Photo resolved bronchitis.  The patient's strep test  is pending.  Rule out strep pharyngitis.  Add in Mucinex DM twice daily.  Zyrtec 10 mg at bedtime.  Nasacort AQ one puff twice daily.  The patient  is to return back with Dr. Kriste Basque as scheduled or sooner if needed.  The  patient is to contact the office if symptoms do not improve or worsen.      Rubye Oaks, NP  Electronically Signed      Lonzo Cloud. Kriste Basque, MD  Electronically Signed   TP/MedQ  DD: 06/01/2007  DT: 06/01/2007  Job #: [id number]

## 2010-12-01 NOTE — Assessment & Plan Note (Signed)
NAMEDELORESE, SELLIN NO.:  [id number]   MEDICAL RECORD NO.:  [id number]          PATIENT TYPE:  POB   LOCATION:  [facility] at [facility]         FACILITY:  [facility]   PHYSICIAN:  Ginger Carne, MD DATE OF BIRTH:  26-Nov-1950   DATE OF SERVICE:                                  CLINIC NOTE   Ms. Motter is here today for routine gynecologic evaluation.  She has had  a total vaginal hysterectomy and at a different time removal of her  right tube and ovary, and at this time complains of hot flashes and  vaginal dryness.  She is a patient of the [facility]  Of note, is her mother had passed away in 08-09-2005and notes  a 40-pound weight loss that she attributes to depression.  She has  spoken to the family practice physicians about this matter.  The patient  otherwise has no significant gynecological complaints.   OB/GYN HISTORY:  She has had 3 full-term vaginal deliveries in the past.   PAST SURGICAL HISTORY:  Cholecystectomy, total vaginal hysterectomy, a  right salpingo-oophorectomy, and tonsillectomy and adenoidectomy.   MEDICAL HISTORY:  Fibromyalgia and low back pain secondary to  degenerative disk disease.   ALLERGIES:  REGLAN and COMPAZINE.   CURRENT MEDICATIONS:  Amitriptyline, Vicodin, and tramadol.   SOCIAL HISTORY:  The patient smokes about 12 to 15 cigarettes a day.  Denies alcohol or illicit drug abuse.   FAMILY HISTORY:  Her father and mother has had coronary artery heart  disease, and her father has had hypertension.   REVIEW OF SYSTEMS:  A 14-point comprehensive review of systems within  normal limits.   PHYSICAL EXAMINATION:  VITAL SIGNS:  Blood pressure 119/84, weight 158  pounds, height 5 feet 6 inches, and pulse 92 and regular.  HEENT:  Grossly normal.  BREAST:  Without masses, discharge, thickenings, or tenderness.  CHEST:  Clear to percussion and auscultation.  CARDIOVASCULAR:  Without murmurs or enlargements,  regular rate and  rhythm.  EXTREMITY, LYMPHATIC, SKIN, NEUROLOGIC, MUSCULOSKELETAL SYSTEMS:  Within  normal limits.  ABDOMEN:  Soft without gross hepatosplenomegaly.  PELVIC:  External genitalia, vulva, and vagina normal.  Cervix is  absent.  The bimanual exam reveals no palpable masses.   IMPRESSION:  Normal gynecological examination and menopause.   PLAN:  The patient does not wish to entertain the idea of utilizing  hormone replacement therapy.  Apparently, her mother had taken estrogen  in the past, did not like it, and the patient is not open to the use of  said medication.  We discussed the importance of calcium and recommended  1000 mg daily with vitamin D included.  She is also in need of a  screening mammogram.  I discussed with the patient to follow up with  [facility] in terms of her depression to be certain that  she would not benefit from an antidepressant.  The patient would also  benefit in other 1 to 2 years from a bone density scan with her history  of smoking.  The patient was advised to utilize Astroglide or other over-  the-counter  vaginal lubricants for vaginal dryness.           ______________________________  Ginger Carne, MD     SHB/MEDQ  D:  01/01/2008  T:  01/02/2008  Job:  [id number]

## 2010-12-04 NOTE — H&P (Signed)
NAMETAYLI, BUCH                 ACCOUNT NO.:  [id number]   MEDICAL RECORD NO.:  [id number]          PATIENT TYPE:  INP   LOCATION:  1317                         FACILITY:  [facility]   PHYSICIAN:  Wilhemina Bonito. Marina Goodell, MD      DATE OF BIRTH:  1950/10/28   DATE OF ADMISSION:  10/17/2006  DATE OF DISCHARGE:                              HISTORY & PHYSICAL   CHIEF COMPLAINT:  Progressive upper abdominal pain x5 days.   HISTORY:  Diane Williams is a pleasant 60 year old white female with history of  degenerative disk disease, fibromyalgia, chronic back pain.  She has  history of depression.  She is status post bilateral tubal ligation,  appendectomy, hysterectomy and cholecystectomy which was done in 1972.  The patient had initially presented to the emergency room on October 07, 2006 and was seen by the ER physician at that time with complaints of  epigastric pain radiating to her back which has been present for about 2  weeks.  Workup in the ER with CT of the abdomen and pelvis showed  significant common bile duct dilation of 15 mm, mildly dilated common  bile duct and no evidence of pancreatic mass.  Subsequent MRCP done on  that same day showed a common bile duct 15 mm, no filling defect.  She  had a borderline prominent pancreatic duct in the head of the pancreas.  She saw Dr. Arlyce Dice on October 12, 2006 and had ERCP done on October 13, 2006  which revealed a prominent papilla, dilated common bile duct diffusely  with probable distal stricture and poor emptying.  Sphincterotomy was  done with prompt emptying of bile.  An 8.5 French 5-cm plastic stent was  placed as there was question of an ampullary CA.  Biopsies of the  prominent papilla showed no atypia.   At this time, the patient presents with pain since she awoke the morning  after her procedure and she says this is different and worse than her  original pain and feels like my gallbladder.  She says pain has been  progressively worse each day.  She  has been in bed all weekend taking  Percocet every 3 hours.  Her appetite has been decreased.  She complains  of nausea without vomiting.  Has had some low-grade fever at home to 99.  She called the office today, was advised to come to the ER for  evaluation.  She relates the pain being a 15/10 at its worst.  At this  time, she is admitted for pain control and supportive therapy for  probable post ERCP pancreatitis.   CURRENT MEDICATIONS:  1. Tranxene 7.5 t.i.d.  2. Amitriptyline 75 q.h.s.   ALLERGIES:  COMPAZINE and REGLAN which causes tremors.   PAST HISTORY:  1. Pertinent for cholecystectomy in 1972, hysterectomy, appendectomy      and bilateral tubal ligation.  2. Fibromyalgia and degenerative disk disease with chronic back pain.  3. Depression.   FAMILY HISTORY:  Father with coronary artery disease.  Negative for GI  disease.   SOCIAL HISTORY:  The patient lives with  her daughter and her  granddaughter.  She is currently on disability.  She is a smoker one  half-pack per day.  No EtOH.   REVIEW OF SYSTEMS:  CARDIOVASCULAR:  Negative for chest pain or anginal  symptoms.  PULMONARY:  Negative for cough, shortness of breath or sputum  production.  GENITOURINARY:  Negative dysuria, urgency or frequency.  MUSCULOSKELETAL:  Pertinent for chronic back pain.  NEURO:  Negative.  GENERAL:  Is pertinent for recent weight loss of approximately 15  pounds.   PHYSICAL EXAM:  Well-developed white female in no acute distress.  She  is uncomfortable.  Temperature is 98.4, blood pressure 135/86, pulse is  94, respirations 22.  HEENT:  Nontraumatic, normocephalic.  EOMI, PERRLA.  Sclerae anicteric.  CARDIOVASCULAR:  Regular rate and rhythm with no murmur, rub or gallop.  PULMONARY:  Clear to A&P.  ABDOMEN:  Soft.  Bowel sounds are active.  She is tender across the  upper abdomen with some guarding.  There is no rebound.  No mass or  hepatosplenomegaly.  RECTAL:  Exam is not done at this  time.  EXTREMITIES:  No clubbing, cyanosis or edema.  NEURO:  Nonfocal.   LABS:  In the ER, WBC is 7.6, hemoglobin 14.8.  Remainder are pending.   IMPRESSION:  54. A 60 year old white female status post endoscopic retrograde      cholangiopancreatography, sphincterotomy, and stent placement for      distal common bile duct stricture October 13, 2006. Now with      progressive abdominal pain x4 days, most consistent with mild a      post endoscopic retrograde cholangiopancreatography pancreatitis.  2. Chronic back pain, degenerative disk disease.  3. Status post bilateral tubal ligation, hysterectomy and      appendectomy.  4. Status post cholecystectomy in 1972.  5. Smoker.  6. Recent weight loss.  7. Fibromyalgia.   PLAN:  The patient is admitted to the service of Dr. Yancey Flemings, a  partner of Dr Hennie Duos, for IV fluid hydration, bowel rest, pain control.  Also, CT scan of the abdomen and pelvis to evaluate for this or other  problems. Further workup pending her course,labs, and findings on CT.      Amy Esterwood, PA-C      Diane N. Marina Goodell, MD  Electronically Signed    AE/MEDQ  D:  10/17/2006  T:  10/17/2006  Job:  [id number]   cc:   Wilhemina Bonito. Marina Goodell, MD  [address]  [place]  [place] [address]

## 2010-12-04 NOTE — Discharge Summary (Signed)
NAMEROSEANA, RHINE                 ACCOUNT NO.:  [id number]   MEDICAL RECORD NO.:  [id number]          PATIENT TYPE:  INP   LOCATION:  1317                         [facility]ACILITY:  [facility]   PHYSICIAN:  Wilhemina Bonito. Marina Goodell, MD      DATE OF BIRTH:  [birth year]   DATE OF ADMISSION:  10/17/2006  DATE OF DISCHARGE:  10/19/2006                               DISCHARGE SUMMARY   ADMITTING DIAGNOSES:  2. 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  2. Rule out perforation or ileus.  3. Perforation or ileus.  4. Chronic back pain with history of degenerative disk disease and      fibromyalgia.  5. Status post bilateral tubal ligation hysterectomy and appendectomy.  6. Status post cholecystectomy in 1972.  7. Smoker.  8. Recent weight loss.   DISCHARGE DIAGNOSES:  1. Acute abdominal pain post endoscopic retrograde      cholangiopancreatography, sphincterotomy and stent placement with      no evidence for complication post stent and no evidence for      pancreatitis.  Etiology of pain is not entirely clear, though may      be related somehow to position of the stent.  2. Rule out perforation or ileus.  3. Perforation or ileus.  4. Chronic back pain with history of degenerative disk disease and      fibromyalgia.  5. Status post bilateral tubal ligation hysterectomy and appendectomy.  6. Status post cholecystectomy in 1972.  7. Smoker.  8. Recent weight loss.   CONSULTATIONS:  None.   PROCEDURES:  CT scan of the abdomen and pelvis and plain abdominal  films.   BRIEF HISTORY:  Jerusalem is a pleasant 60 year old white female with  history as described above.  She had undergone remote cholecystectomy in  1972.   Initially she presented to the emergency room on March21,2008,  was seen by the ER physician at that time with complaints of epigastric  pain radiating to her back which had been present for a couple of weeks.  Workup in the ER with CT of the abdomen and pelvis showed significant  common bile duct dilation of 15 mm, a mildly dilated pancreatic duct,  and no evidence of pancreatic mass.  She also had MRCP done on that same  day showing the common bile duct to be 15 mm.  There was no filling  defect.  Borderline prominent pancreatic duct in the head of the  pancreas.  She was referred to Dr. Arlyce Dice, seen on March 26, and  underwent ERCP on March 27 which showed a prominent papilla which was  biopsied, dilated common bile duct diffusely, and probable distal  stricture.  There was initially very poor emptying of the bile duct.  Sphincterotomy was done then with prompt emptying of bile.  An 8.5  French 5-cm plastic stent was placed as there was question of an  underlying ampullary CA.  Biopsies have since returned showing no  atypia.   At this time, the patient presents stating that she awoke the morning  after her procedure with abdominal pain which was different than her  original pain and feels like her gallbladder.  She says that this pain  has been progressively worse each day.  She had been in bed all weekend  taking Percocet every 3 hours.  Her appetite has been decreased and she  complains of associated nausea.  She has had some low-grade temperature  at home in the 99 range.  She called the office, was advised to come to  the ER.  She was seen and evaluated complaining of significant pain,  stating that at its worse her pain is 15/10.  She is admitted for pain  control and supportive management with probable post-ERCP pancreatitis.   LABORATORY STUDIES:  On admission, amylase was 62, lipase was 18.  Electrolytes within normal limits.  Creatinine 0.5.  Liver function  studies normal  with the exception of an alk phos at 124.  WBC of 7.6,  hemoglobin 14.8, hematocrit of 43.8.  Followup on April 1:  Amylase of  45, lipase 16.   X-RAY STUDIES:  Plain abdominal films on admission showed expected  pneumobilia, biliary stent mostly in the duodenum but the proximal  portion was located in the distal part of the common bile duct.  CT scan  of the abdomen and pelvis also on March 31 showed interval insertion of  common bile duct stent extending across the ampulla into the duodenum  with pneumobilia, slight improvement in common bile duct diameter to 11  mm, no significant intrahepatic ductal dilation, no evidence for  pancreatitis, and no fluid collection.   HOSPITAL COURSE:  The patient was admitted to the service of Dr. Yancey Flemings who was covering the hospital.  She was initially placed at bowel  rest, started on IV fluids, had IV Zofran and morphine for control of  pain and nausea.  Plain abdominal films were done which did not show any  evidence of free air.  The stent appeared to be in good position.  CT  scan of the abdomen and pelvis was also done to rule out any other  complication post ERCP.  Her amylase and lipase were normal and there  was no evidence of pancreatitis on the CT scan.  She continued to  complain of pain and required IV narcotics throughout her hospital stay,  though her pain definitely did not progress.Her bdomen was completely  benign on exam.  She did not manifest any fever.  Her white count was  normal and she was able to keep down p.o.'s without difficulty.  The  etiology of her pain was not entirely clear.  It may be related to the  stent itself causing irritation, but this would be unusual.  Plan  currently is for her to follow up with Dr. Arlyce Dice as he plans to remove  the stent in a few more weeks.  On April 2 she was allowed discharge to  home with instructions to follow up with Dr. Arlyce Dice on Tuesday, April8, at 3:15 p.m.  Call for any  problems in the interim.   She was to remain on:  1. Protonix 40 mg b.i.d. in the short term.  2. Percocet one every 4-6 hours as needed for pain.  3. Amitriptyline 75 q.h.s. as previous.  4. Tranxene 7.5 t.i.d. as previous.   DIET:  Regular.   CONDITION ON DISCHARGE:  Stable.      Amy Esterwood, PA-C      John N. Marina Goodell, MD  Electronically Signed    AE/MEDQ  D:  10/21/2006  T:  10/21/2006  Job:  [id number]

## 2010-12-04 NOTE — Assessment & Plan Note (Signed)
[facility]                               PULMONARY OFFICE NOTE   NAME:CLAPPMeesha, [name]                          MRN:          [id number]  DATE:05/30/2006                            DOB:          1951-05-29    HISTORY OF PRESENT ILLNESS:  The patient is a 60 year old white female  patient of Dr. Kriste Williams, who has a known history of asthmatic bronchitis in a  patient who continues to smoke, fibromyalgia, and anxiety and depression,  who presents for an acute office visit.  The patient complains of a 3 day  history of burning with urination, urinary urgency, frequency, and  hematuria.  The patient also has recently been treated for an acute  tracheobronchitis with a 7 day course of Avalox and a prednisone taper 3  weeks ago.  She reports she is improved.  However, continues to have  significant coughing.  The patient denies any hemoptysis, orthopnea, PND,  leg swelling, fever, vomiting, or back pain.  The patient does have some  loose stools over the last few days.  However, it has resolved with Imodium  AD in the last 24 hours.   PAST MEDICAL HISTORY:  Reviewed.   CURRENT MEDICATIONS:  Reviewed.   PHYSICAL EXAM:  The patient is a pleasant female in no acute distress.  She is afebrile with stable vital signs.  O2 saturation is 98% on room air.  HEENT:  Unremarkable.  Sclerae nonicteric.  Posterior pharynx is clear.  NECK:  Supple without adenopathy.  No JVD.  LUNGS:  Fields reveal coarse breath sounds without any wheezing or crackles.  CARDIAC:  Regular rate and rhythm.  ABDOMEN:  Soft with positive bowel sounds throughout all 4 quadrants.  No  guarding or rebound noted.  No hepatosplenomegaly.  The patient has a well-  healed mid abdominal surgical scar.  Negative CVA tenderness.  EXTREMITIES:  Warm without calf cyanosis, clubbing, or edema.  SKIN:  Warm and dry without any rash.   DATA:  Chest x-ray done on May 11, 2006 showed resolution of  previous  left lower lobe infiltrate and some slight bibasilar patchy atelectatic  changes.  A urinalysis revealed some positive leukocytes.   IMPRESSION AND PLAN:  1. Acute cystitis.  The patient does report she has had several urinary      tract infections in the last year.  She will begin Cipro 500 mg b.i.d.      x7 days.  Urine culture is pending.  The patient will follow back up in      2 weeks for followup.  If the patient continues to have symptoms will      need referral to urology.  The patient is to increase her fluid intake.  2. Slow-to-resolve tracheobronchitis.  The patient is to continue on      Mucinex DM.  Encouraged all smoking cessation and may use Tussionex as      needed.  3. The patient does report that she has recently moved and has lost 3 of      her prescriptions, including  Ultram, Vicodin, and Tranxene.  The      patient was given a 2 week supply only and      was documented with [facility].  The patient is aware these drugs are      all addicting and can cause dependency problems if over used      incorrectly.      Diane Oaks, NP  Electronically Signed      Diane Williams. Diane Basque, MD  Electronically Signed   TP/MedQ  DD: 05/30/2006  DT: 05/30/2006  Job #: [id number]

## 2010-12-04 NOTE — Assessment & Plan Note (Signed)
[facility]                         GASTROENTEROLOGY OFFICE NOTE   NAME:CLAPPManasvi, Dickard                          MRN:          [id number]  DATE:11/02/2006                            DOB:          Dec 17, 1950    PROBLEM:  Abdominal pain.   Mrs. Repetto continues to complain of severe right upper quadrant and  upper mid epigastric pain. She is requiring Percocet every four hours  for control. It is actually worsened with a bowel movement. It awakens  her at night. This is pain that began following her ERCP, sphincterotomy  and stent placement. It is different than her prior complaints.   Repeat LFTs today were normal. Amylase and lipase were also  unremarkable.   PHYSICAL EXAMINATION:  Pulse 104, blood pressure 124/76, weight 177.  ABDOMEN: She has mild tenderness in the right upper quadrant and right  peri-umbilical areas without guarding or rebound. There are no abdominal  masses or organomegaly.   IMPRESSION:  Persistent post ERCP abdominal pain. The patient describes  the pain like there is a foreign body. It is a conceivable that her pain  could be due to her biliary stent.   RECOMMENDATIONS:  ERCP with stent removal.     Barbette Hair. Arlyce Dice, MD,FACG  Electronically Signed    RDK/MedQ  DD: 11/02/2006  DT: 11/02/2006  Job #: [id number]

## 2010-12-04 NOTE — Assessment & Plan Note (Signed)
[facility]                                 ON-CALL NOTE   MACEL, YEARSLEY                          MRN:          [id number]  DATE:11/02/2006                            DOB:          Feb 17, 1951    This is a patient of Dr. Melvia Heaps.   She called the answering service at approximately 5:45 today, November 02, 2006.  She said that she was told that Dr. Marzetta Board nurse was going to  fax over a prescription for stronger pain medication.  She has a stent  in place between her pancreas and bile duct and it is causing a great  deal of pain and that she needs to have this removed.  In the meantime,  she was promised more pain medicine.  She told me she did not have any  pain medication available at this time.  I called the pharmacist at her  pharmacy and he indicated she had received a prescription for #100  Vicodin 5/500 on April 2 and was not to exceed three a day.  There is no  way I can authorize any other stronger narcotics at this point and the  pharmacist was going to advise the patient to use that medication  available and recontact our office in the morning to try to sort out  exactly what pain medication Dr. Arlyce Dice intended for her to have.     Iva Boop, MD,FACG  Electronically Signed    CEG/MedQ  DD: 11/02/2006  DT: 11/03/2006  Job #: [id number]   cc:   Barbette Hair. Arlyce Dice, MD,FACG

## 2010-12-04 NOTE — Assessment & Plan Note (Signed)
[facility]                         GASTROENTEROLOGY OFFICE NOTE   NAME:CLAPPTerril, Diane Williams                          MRN:          [id number]  DATE:10/12/2006                            DOB:          18-Nov-1950    PROBLEM:  Abdominal pain.  Ms. Bouley was referred by the emergency room  for evaluation.  Over the past 2 weeks she has been complaining of  severe mid epigastric pain that radiates to her back.  It is perhaps  worsened with eating.  She was seen in the ER several days ago, where  she underwent an extensive workup including blood work, CT, and MRCP.  Liver tests were remarkable for an alkaline phosphatase of 125.  Other  liver tests were normal.  CT demonstrated a 15-mm common bile duct and  slight dilatation of the pancreatic duct.  MRCP confirmed at 15-mm  common bile duct with tapered appearance in the distal bile duct.  There  is also borderline prominence of the pancreatic duct.  No definite mass  or lesion was seen.  She was sent home on Percocet.  Pain continues,  though it is controlled with Percocet.  There is no history of  pancreatitis or alcohol abuse.  She is on no gastric irritants,  including nonsteroidals.  She does have occasional pyrosis.   PAST MEDICAL HISTORY:  Pertinent for depression.  She is status post  cholecystectomy, hysterectomy, tubal ligation, and appendectomy.   FAMILY HISTORY:  Pertinent for father with heart disease.   MEDICATIONS:  Triamterene and amitriptyline.   SHE IS ALLERGIC TO COMPAZINE.  SHE DEVELOPS TREMORS WITH REGLAN.   She smokes 1/2 pack a day.  She does not drink.  She is single, and  works as a Human resources officer.   REVIEW OF SYSTEMS:  Was reviewed, and is positive for feet swelling,  joint pains, back pain, excessive urination, and fatigue.   EXAMINATION:  Pulse 84.  Blood pressure 126/74.  HEENT: EOMI. PERRLA. Sclerae are anicteric.  Conjunctivae are pink.  NECK:  Supple without  thyromegaly, adenopathy or carotid bruits.  CHEST:  Clear to auscultation and percussion without adventitious  sounds.  CARDIAC:  Regular rhythm; normal S1 S2.  There are no murmurs, gallops  or rubs.  ABDOMEN:  She has mild right upper quadrant tenderness without guarding  or rebound.  There are no abdominal masses or organomegaly.  Bowel  sounds are normoactive.  Abdomen is soft, non-tender and non-distended.  There are no abdominal masses, tenderness, splenic enlargement or  hepatomegaly.  EXTREMITIES:  Full range of motion.  No cyanosis, clubbing or edema.  RECTAL:  Deferred.   IMPRESSION:  Persistent severe abdominal pain with abnormal scans  demonstrating bile duct dilatation and minimal pancreatic duct  dilatation.  She may have obstruction of the distal bile duct from  either a neoplasm or retained stone.  Pancreatitis would also have to be  considered (amylase and lipase were not drawn).   RECOMMENDATION:  1. Check amylase and lipase with repeat LFTs.  2. ERCP.     Barbette Hair. Arlyce Dice, MD,FACG  Electronically  Signed    RDK/MedQ  DD: 10/12/2006  DT: 10/12/2006  Job #: [id number]

## 2010-12-04 NOTE — Assessment & Plan Note (Signed)
[facility]                         GASTROENTEROLOGY OFFICE NOTE   KARTER, HAIRE                          MRN:          [id number]  DATE:10/25/2006                            DOB:          March 31, 1951    Mrs. Vecchiarelli has returned following ERCP and sphincterotomy. ERCP  demonstrated marked dilatation of the biliary tree down to the level of  the papilla. Emptying was very poor. The papilla was also prominent. A  sphincterotomy was done, which improved emptying of bile. Biopsies were  taken of the ampulla to rule out ampullary cancer and a stent was  inserted. Since the procedure, she developed severe intermittent right  upper quadrant pain with radiation to her back that she identifies as  exactly like her previous gallbladder pain. She did not notify the  office until three days later. At which point, she was promptly  admitted. Interestingly, amylase and lipase were normal as were her  liver tests. CT of the abdomen demonstrated improvement in the bile duct  diameter with a stent in place. She was discharged two days later. Since  that time, she has continued to have right upper quadrant pain that has  required narcotics. She is taking Percocet around the clock. She does  state that the pain is improved. She is without fever or chills. Last  did a liver test on March 31 and was pertinent for an alkaline phos of  124. Other liver tests were normal.   PHYSICAL EXAMINATION:  Pulse 88, blood pressure 118/62.   IMPRESSION:  1. Persistent pain post ERCP. Etiology of this is unclear. It does not      appear that she has pancreatitis. This is certainly the most      logical conclusion, even in the absence of pancreatic enzyme      elevation.  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   RECOMMENDATIONS:  Will continue to follow her clinically. The stent will  eventually have to be replaced. If pain is persistent in a weeks time, I  will remove the stent  earlier.     Barbette Hair. Arlyce Dice, MD,FACG  Electronically Signed    RDK/MedQ  DD: 10/25/2006  DT: 10/25/2006  Job #: [id number]

## 2010-12-07 ENCOUNTER — Ambulatory Visit (INDEPENDENT_AMBULATORY_CARE_PROVIDER_SITE_OTHER): Payer: Medicare Other | Admitting: Pulmonary Disease

## 2010-12-07 ENCOUNTER — Encounter: Payer: Self-pay | Admitting: Pulmonary Disease

## 2010-12-07 DIAGNOSIS — F329 Major depressive disorder, single episode, unspecified: Secondary | ICD-10-CM

## 2010-12-07 DIAGNOSIS — K219 Gastro-esophageal reflux disease without esophagitis: Secondary | ICD-10-CM

## 2010-12-07 DIAGNOSIS — B029 Zoster without complications: Secondary | ICD-10-CM

## 2010-12-07 DIAGNOSIS — J40 Bronchitis, not specified as acute or chronic: Secondary | ICD-10-CM

## 2010-12-07 DIAGNOSIS — IMO0001 Reserved for inherently not codable concepts without codable children: Secondary | ICD-10-CM

## 2010-12-07 DIAGNOSIS — M545 Low back pain: Secondary | ICD-10-CM

## 2010-12-07 DIAGNOSIS — F419 Anxiety disorder, unspecified: Secondary | ICD-10-CM

## 2010-12-07 DIAGNOSIS — E538 Deficiency of other specified B group vitamins: Secondary | ICD-10-CM

## 2010-12-07 DIAGNOSIS — N390 Urinary tract infection, site not specified: Secondary | ICD-10-CM

## 2010-12-07 DIAGNOSIS — R945 Abnormal results of liver function studies: Secondary | ICD-10-CM

## 2010-12-07 DIAGNOSIS — F341 Dysthymic disorder: Secondary | ICD-10-CM

## 2010-12-07 DIAGNOSIS — K5730 Diverticulosis of large intestine without perforation or abscess without bleeding: Secondary | ICD-10-CM

## 2010-12-07 DIAGNOSIS — Z0000 Encounter for general adult medical examination without abnormal findings: Secondary | ICD-10-CM

## 2010-12-07 DIAGNOSIS — K589 Irritable bowel syndrome without diarrhea: Secondary | ICD-10-CM

## 2010-12-07 DIAGNOSIS — N3010 Interstitial cystitis (chronic) without hematuria: Secondary | ICD-10-CM

## 2010-12-07 DIAGNOSIS — F17200 Nicotine dependence, unspecified, uncomplicated: Secondary | ICD-10-CM

## 2010-12-07 MED ORDER — CILIDINIUM-CHLORDIAZEPOXIDE 2.5-5 MG PO CAPS: 1.0000 | ORAL_CAPSULE | Freq: Three times a day (TID) | ORAL | Status: AC | PRN

## 2010-12-07 MED ORDER — GABAPENTIN 100 MG PO CAPS: 100.0000 mg | ORAL_CAPSULE | Freq: Three times a day (TID) | ORAL | Status: DC

## 2010-12-07 MED ORDER — HYDROCODONE-ACETAMINOPHEN 5-500 MG PO TABS: ORAL_TABLET | ORAL | Status: DC

## 2010-12-07 MED ORDER — CLORAZEPATE DIPOTASSIUM 7.5 MG PO TABS: 7.5000 mg | ORAL_TABLET | Freq: Three times a day (TID) | ORAL | Status: AC

## 2010-12-07 MED ORDER — TRAMADOL HCL 50 MG PO TABS: 50.0000 mg | ORAL_TABLET | Freq: Three times a day (TID) | ORAL | Status: DC | PRN

## 2010-12-07 NOTE — Patient Instructions (Signed)
Today we updated your med list in EPIC...    We refilled the Gabapentin, Librax, Tranxene, & Vicodin...  Please return to our lab one morning this week for your fasting blood work...    Then call the PHONE TREE in a few days for your results...    Dial [id number] & when prompted enter your patient number followed by the # symbol...    Your patient number is:  [id number]#  Please cut back further on your smoking & work on smoking cessation...  Stay as active as possible...  Call for any questions...  Let's plan a follow up recheck in 53mo, sooner for any problems.Marland KitchenMarland Kitchen

## 2010-12-07 NOTE — Progress Notes (Signed)
Subjective:    Patient ID: Diane Williams, female    DOB: 11/02/1950, 60 y.o.   MRN: [id number]  HPI 60 y/o WF here for a follow up visit... she has mult med issues as noted below... she has been unable to quit smoking despite all our prev conversations & recommendations...  ~  December 22, 2009:  36mo follow up & not feeling well, she says... wonders if its the heat... 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 she is still smoking ~1/2ppd> min cough/ phlegm, etc... last CXR11/10= NAD... she has IC followed by Urology in [place]... persistant FM symptoms> on Amitrip, Vicodin, Tramadol... mod anxiety on Gertie Gowda but wants to ch back to [employer]... OK TDAP today.  ~  June 22, 2010:  6 mo ROV- notes painful rash on upper chest & back on left side c/w shingles x 2d... c/o severe pain- not relieved by Vicodin & Tramadol, lying in fetal position on exam table today... we discussed Rx w/ FAMVIR 500mg Tid, Depo80/ Dosepak, & Duragesic-50...  she just has CSpine surg (ant cerv disckectomy) by DrKritzer 06/02/10...  still smoking 1/2 ppd & requesting smoking cessation help- Rx for Chantix written... due for yearly CXR & blood work but wants to wait due to severe shingles pain... GI followed by DrKaplan & stable IBS symptoms...  IC still followed by Urology in [place]... FM & back pain> she wants Vicodin & Tramadol refilled, & asking to switch the Clorazepate to Ativan for nerves...  ~  August 20, 2010:  she was [facility] 12[facility] for atyp[facility]> ruled out for cardiac prob & seen by DrNasher- disch on Percocet, Tramadol, Tylenol, Gabapentin, Amitriptyline... she continues to follow up w/ DrKritzer regarding her CSpine surg... still smoking ~1/2 ppd & intol to Chantix she says... c/o URI congestion & persist  itching from the shingles> we discussed ZPak & Atarax...  ~  Dec 07, 2010:  73mo ROV & she states improved> she's been caring for her 50 y/o father & things have calmed down at home, felling better overall w/ Celexa added;  Requests refills Gabapentin (for post herpetic neuralgia), Librax (for IBS), Vicodin (for back pain & FM) & change Ativan back to Tranxene...  She will return for fasting blood work>>          Problem List:  IRITIS (ICD-364.3) - see EMR note of 08/30/07 > Iritis followed by DrCohen of [facility]... we did a number of tests in 2009- collagen vasc screen was normal... pt reports she has early cataracts as well and is being followed...  CIGARETTE SMOKER (ICD-305.1) - still smoking 1/2 ppd w/ no interest in quitting...  we discussed smoking cessation strategies, she tried Chantix but states intol w/ nausea...  Hx of BRONCHITIS (ICD-490) - hx recurrent bronchitic infections w/ cough, congestion, sputum production etc... advised to quit smoking, use Mucinex, Fluids, etc... she denies recent infections etc... ~  CXR 11/10 was clear, NAD.[name]. ~  CXR 12/11 was clear x sl incr basilar markings...   GERD (ICD-530.81) - uses Prn Prilosec...  IRRITABLE BOWEL SYNDROME & DIVERTICULOSIS OF COLON - we discussed trying Activa/ Align/ etc... Prev on Bentyl but insurance won't cover> change to LIBRAX Prn.... ~  colonoscopy 5/99 by Dorris Singh showed few divertics... due for f/u colon. ~  she had CTAbd 3/08 w/ diverticulosis seen... ~  colonoscopy for rectal bleeding 9/10 showed +adenomatous polyp removed, divertics and hems (bleeding likely from hems) & Rx w/ Anusol-HC suppos...  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 she had ERCP w/ sphincterotomy and stent~ all benign... no signs of pancreatitis... mod post ERCP pain that  was persistant untl the stent was removed... she has PHENERGAN for nausea Prn. ~  she had ERCP & AbdSonar by Dorris Singh 7/10 w/ hx papillary stenosis & stent for sludge (LFTs were norm)- stone fragments seen, dilated ducts, no obstructing lesions seen... ~  11/10: labs showed AlkPhos= 181, SGOT= 52, SGPT= 53... sent to GIi to review. ~  f/u LFT's 1/11 were WNL.[name]. 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 ~  Cysto 7/10- for hematuria & bladder symptoms- mild chr cystitis... on Pyridium.  BACK PAIN, LUMBAR (ICD-724.2) - she takes VICODIN Prn, and TRAMADOL Prn w/ control of her back pain and generalized muscle pain...  FIBROMYALGIA (ICD-729.1) - full eval in 12/23/01 by DrDeveshwar... still taking AMITRIPYLINE 75mg HS.  Hx SHINGLES>  She requests GABAPENTIN 100mg  Tid refill for post herpetic neuralgia...  HEADACHE (ICD-784.0) - prev seen by Neuro, DrSchmidt in the past...  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 she has alternated requests for CHLORAZEPATE 7.5mg  Tid & LORAZEPAM 1mg  Tid.   Past Surgical History  Procedure Date  . Appendectomy   . Hysterectomy and right oopherectomy   . Cholecystectomy   . Common bile duct stent by ercp w/sphincterotomy   . Tonsillectomy   . Tubal ligation   . Cspine surgery 05/2010    by Dr. Gerlene Fee    Outpatient Encounter Prescriptions as of 12/07/2010  Medication Sig Dispense Refill  . amitriptyline (ELAVIL) 25 MG tablet Take 3 tablets by mouth at bedtime       . citalopram (CELEXA) 20 MG tablet Take 1 tablet (20 mg total) by mouth daily.  30 tablet  5  . HYDROcodone-acetaminophen (VICODIN) 5-500 MG per tablet 1 tablet 3 times a day as needed for severe pain  90 tablet  0  . LORazepam (ATIVAN) 1 MG tablet 1 tablet 3 times a day ss needed for nerves  90 tablet  0  . traMADol (ULTRAM) 50 MG tablet Take 50 mg by mouth  every 8 (eight) hours as needed.        . gabapentin (NEURONTIN) 100 MG capsule Start with 1 by mouth at bedtime and increase slowly to 3 tablets at bedtime       . DISCONTD: dicyclomine (BENTYL) 20 MG tablet Take 20 mg by mouth 3 (three) times daily as needed. For severe abd cramping         Allergies  Allergen Reactions  . Metoclopramide Hcl     REACTION: tremors  . Prochlorperazine Edisylate     REACTION: tremors  . Varenicline Tartrate     REACTION: nausea    Review of Systems         See HPI - all other systems neg except as noted... The patient complains of chest pain and dyspnea on exertion.  The patient denies anorexia, fever, weight loss, weight gain, vision loss, decreased hearing, hoarseness, syncope, peripheral edema, prolonged cough,  headaches, hemoptysis, abdominal pain, melena, hematochezia, severe indigestion/heartburn, hematuria, incontinence, muscle weakness, suspicious skin lesions, transient blindness, difficulty walking, depression, unusual weight change, abnormal bleeding, enlarged lymph nodes, and angioedema.    Objective:   Physical Exam     WD, WN, 60 y/o WF in NAD... she is chr ill appearing... GENERAL:  Alert & oriented; pleasant & cooperative... HEENT:  [place]/AT, EOM-full, PERRLA, EACs-clear, TMs-wnl, NOSE-clear, THROAT-clear & wnl. NECK:  Supple w/ fairROM; no JVD; normal carotid impulses w/o bruits; no thyromegaly or nodules palpated; no lymphadenopathy. CHEST:  Clear to P & A; without wheezes/ rales/ or rhonchi heard... Rash c/w shingles left chest wall ~ T3-4 distrib. HEART:  Regular Rhythm; without murmurs/ rubs/ or gallops detected... ABDOMEN:  Soft & nontender no guarding or rebound, BS +x4Q, neg cva tenderness, no masses palp... EXT:  without deformities,  no varicose veins/ venous insuffic/ or edema. NEURO:  CN's intact;  no focal neuro deficits... DERM:  No lesions noted; no rash etc...   Assessment & Plan:   SMOKER, Hx Bronchitis>  Still  smoking, refuses smoking cessation help, no recent bronchitic exac...  IBS>  We will switch to Librax for better insurance coverage, she will f/u w/ GI is symptoms don't abate...  GU>  Followed by Urology in [place]> stable at present...  Back Pain/ FM/ chr pain syndrome>  She has Vicodin lim to 3/d, and Tramadol/ Neurontin/ etc...  Anxiety/ Depression>  On Celexa, & requests Chlorazepate rx.Marland KitchenMarland Kitchen

## 2010-12-14 ENCOUNTER — Encounter: Payer: Self-pay | Admitting: Pulmonary Disease

## 2010-12-17 ENCOUNTER — Telehealth: Payer: Self-pay | Admitting: Pulmonary Disease

## 2010-12-17 NOTE — Telephone Encounter (Signed)
Lab orders have already been placed. Pt aware ok to come on Monday. Carron Curie, CMA

## 2011-01-26 ENCOUNTER — Telehealth: Payer: Self-pay | Admitting: Pulmonary Disease

## 2011-01-26 MED ORDER — HYDROCODONE-ACETAMINOPHEN 5-500 MG PO TABS: ORAL_TABLET | ORAL | Status: DC

## 2011-01-26 NOTE — Telephone Encounter (Signed)
Labs are still in the computer for patient to have drawn. Also, SN is out of the office will see if TP is okay to refill this time. Pt last seen 12-07-10 and given refill for #90 no refills. Please advise. Per TP okay to send refill x 3 under SN name.    Pt aware of labs and refill sent.

## 2011-01-27 ENCOUNTER — Other Ambulatory Visit (INDEPENDENT_AMBULATORY_CARE_PROVIDER_SITE_OTHER): Payer: Medicare Other

## 2011-01-27 DIAGNOSIS — R945 Abnormal results of liver function studies: Secondary | ICD-10-CM

## 2011-01-27 DIAGNOSIS — N390 Urinary tract infection, site not specified: Secondary | ICD-10-CM

## 2011-01-27 DIAGNOSIS — R7989 Other specified abnormal findings of blood chemistry: Secondary | ICD-10-CM

## 2011-01-27 DIAGNOSIS — F411 Generalized anxiety disorder: Secondary | ICD-10-CM

## 2011-01-27 DIAGNOSIS — E538 Deficiency of other specified B group vitamins: Secondary | ICD-10-CM

## 2011-01-27 DIAGNOSIS — F419 Anxiety disorder, unspecified: Secondary | ICD-10-CM

## 2011-01-27 DIAGNOSIS — Z0000 Encounter for general adult medical examination without abnormal findings: Secondary | ICD-10-CM

## 2011-01-27 LAB — CBC WITH DIFFERENTIAL/PLATELET
Basophils Absolute: 0 10*3/uL (ref 0.0–0.1)
Basophils Relative: 0.2 % (ref 0.0–3.0)
Eosinophils Absolute: 0.1 10*3/uL (ref 0.0–0.7)
Hemoglobin: 14.1 g/dL (ref 12.0–15.0)
Lymphocytes Relative: 32.2 % (ref 12.0–46.0)
Monocytes Relative: 7.3 % (ref 3.0–12.0)
Neutro Abs: 5.5 10*3/uL (ref 1.4–7.7)
Neutrophils Relative %: 59 % (ref 43.0–77.0)
RBC: 4.51 Mil/uL (ref 3.87–5.11)

## 2011-01-27 LAB — BASIC METABOLIC PANEL
Calcium: 9.2 mg/dL (ref 8.4–10.5)
GFR: 112.85 mL/min (ref >60.00)
Potassium: 4.3 mEq/L (ref 3.5–5.1)
Sodium: 142 mEq/L (ref 135–145)

## 2011-01-27 LAB — LIPID PANEL
HDL: 70.7 mg/dL (ref >39.00)
Total CHOL/HDL Ratio: 2
VLDL: 6.8 mg/dL (ref 0.0–40.0)

## 2011-01-27 LAB — VITAMIN B12: Vitamin B-12: 214 pg/mL (ref 211–911)

## 2011-01-27 LAB — HEPATIC FUNCTION PANEL
AST: 21 U/L (ref 0–37)
Alkaline Phosphatase: 118 U/L — ABNORMAL HIGH (ref 39–117)
Bilirubin, Direct: 0.1 mg/dL (ref 0.0–0.3)

## 2011-01-27 LAB — SEDIMENTATION RATE: Sed Rate: 19 mm/hr (ref 0–22)

## 2011-02-02 ENCOUNTER — Telehealth: Payer: Self-pay | Admitting: Pulmonary Disease

## 2011-02-02 DIAGNOSIS — N390 Urinary tract infection, site not specified: Secondary | ICD-10-CM

## 2011-02-02 MED ORDER — AMITRIPTYLINE HCL 25 MG PO TABS: ORAL_TABLET | ORAL | Status: DC

## 2011-02-02 NOTE — Telephone Encounter (Signed)
Niece aware pt needs to do a clean catch urine in a sterile cup and this can be refrigerated for up to 24 hours before testing.  Niece says the pt also needs a refill for amitriptyline sent to [facility]. RX sent. Pt was unavailable so unsure why pt is bringing urine in to the lab. Will place order for [facility].

## 2011-02-03 ENCOUNTER — Other Ambulatory Visit: Payer: Self-pay | Admitting: *Deleted

## 2011-02-03 LAB — URINALYSIS, ROUTINE W REFLEX MICROSCOPIC
Bilirubin Urine: NEGATIVE
Nitrite: NEGATIVE
Specific Gravity, Urine: >=1.03 (ref 1.000–1.030)
Urobilinogen, UA: 0.2 (ref 0.0–1.0)
pH: 5.5 (ref 5.0–8.0)

## 2011-05-12 ENCOUNTER — Telehealth: Payer: Self-pay | Admitting: Pulmonary Disease

## 2011-05-12 MED ORDER — HYDROCODONE-ACETAMINOPHEN 5-500 MG PO TABS: ORAL_TABLET | ORAL | Status: DC

## 2011-05-12 NOTE — Telephone Encounter (Signed)
Yes ok to refill the vicodin.  thanks

## 2011-05-12 NOTE — Telephone Encounter (Signed)
Called med to [facility] [employer] [facility], spoke with [place]. Spoke with pt and she is aware rx refilled.

## 2011-05-12 NOTE — Telephone Encounter (Signed)
Dr Kriste Basque, please advise if okay to refill her vicodin 5/500. She last had this filled on 04/14/11 #90 tablets. She has Nov appt pending with you. Thanks

## 2011-05-21 ENCOUNTER — Other Ambulatory Visit: Payer: Self-pay | Admitting: Pulmonary Disease

## 2011-05-21 MED ORDER — GABAPENTIN 100 MG PO CAPS: 100.0000 mg | ORAL_CAPSULE | Freq: Three times a day (TID) | ORAL | Status: DC

## 2011-05-24 ENCOUNTER — Ambulatory Visit: Payer: Self-pay | Admitting: Ophthalmology

## 2011-06-03 ENCOUNTER — Other Ambulatory Visit: Payer: Self-pay | Admitting: *Deleted

## 2011-06-03 MED ORDER — TRAMADOL HCL 50 MG PO TABS: 50.0000 mg | ORAL_TABLET | Freq: Three times a day (TID) | ORAL | Status: DC | PRN

## 2011-06-07 ENCOUNTER — Encounter: Payer: Self-pay | Admitting: Pulmonary Disease

## 2011-06-07 ENCOUNTER — Other Ambulatory Visit (INDEPENDENT_AMBULATORY_CARE_PROVIDER_SITE_OTHER): Payer: Medicare Other

## 2011-06-07 ENCOUNTER — Other Ambulatory Visit: Payer: Self-pay | Admitting: Pulmonary Disease

## 2011-06-07 ENCOUNTER — Ambulatory Visit (INDEPENDENT_AMBULATORY_CARE_PROVIDER_SITE_OTHER): Payer: Medicare Other | Admitting: Pulmonary Disease

## 2011-06-07 ENCOUNTER — Ambulatory Visit (INDEPENDENT_AMBULATORY_CARE_PROVIDER_SITE_OTHER)
Admission: RE | Admit: 2011-06-07 | Discharge: 2011-06-07 | Disposition: A | Discharge status: Home / Self Care | Payer: Medicare Other | Source: Ambulatory Visit | Attending: Pulmonary Disease | Admitting: Pulmonary Disease

## 2011-06-07 DIAGNOSIS — F17200 Nicotine dependence, unspecified, uncomplicated: Secondary | ICD-10-CM

## 2011-06-07 DIAGNOSIS — K589 Irritable bowel syndrome without diarrhea: Secondary | ICD-10-CM

## 2011-06-07 DIAGNOSIS — N390 Urinary tract infection, site not specified: Secondary | ICD-10-CM

## 2011-06-07 DIAGNOSIS — M545 Low back pain, unspecified: Secondary | ICD-10-CM

## 2011-06-07 DIAGNOSIS — J40 Bronchitis, not specified as acute or chronic: Secondary | ICD-10-CM

## 2011-06-07 DIAGNOSIS — E538 Deficiency of other specified B group vitamins: Secondary | ICD-10-CM

## 2011-06-07 DIAGNOSIS — F32A Depression, unspecified: Secondary | ICD-10-CM

## 2011-06-07 DIAGNOSIS — B0229 Other postherpetic nervous system involvement: Secondary | ICD-10-CM

## 2011-06-07 DIAGNOSIS — K5730 Diverticulosis of large intestine without perforation or abscess without bleeding: Secondary | ICD-10-CM

## 2011-06-07 DIAGNOSIS — F329 Major depressive disorder, single episode, unspecified: Secondary | ICD-10-CM

## 2011-06-07 DIAGNOSIS — IMO0001 Reserved for inherently not codable concepts without codable children: Secondary | ICD-10-CM

## 2011-06-07 LAB — URINALYSIS, ROUTINE W REFLEX MICROSCOPIC
Nitrite: NEGATIVE
Specific Gravity, Urine: >=1.03 (ref 1.000–1.030)
Urobilinogen, UA: 0.2 (ref 0.0–1.0)

## 2011-06-07 LAB — URINALYSIS
Specific Gravity, Urine: >=1.03 (ref 1.000–1.030)
Total Protein, Urine: NEGATIVE
Urine Glucose: NEGATIVE
pH: 6 (ref 5.0–8.0)

## 2011-06-07 LAB — VITAMIN B12: Vitamin B-12: 238 pg/mL (ref 211–911)

## 2011-06-07 MED ORDER — CITALOPRAM HYDROBROMIDE 20 MG PO TABS: 20.0000 mg | ORAL_TABLET | Freq: Every day | ORAL | Status: DC

## 2011-06-07 MED ORDER — GABAPENTIN 100 MG PO CAPS: 100.0000 mg | ORAL_CAPSULE | Freq: Three times a day (TID) | ORAL | Status: DC

## 2011-06-07 MED ORDER — LORAZEPAM 1 MG PO TABS: 1.0000 mg | ORAL_TABLET | Freq: Three times a day (TID) | ORAL | Status: AC

## 2011-06-07 MED ORDER — TRAMADOL HCL 50 MG PO TABS: 50.0000 mg | ORAL_TABLET | Freq: Three times a day (TID) | ORAL | Status: DC | PRN

## 2011-06-07 MED ORDER — AMITRIPTYLINE HCL 25 MG PO TABS: ORAL_TABLET | ORAL | Status: DC

## 2011-06-07 MED ORDER — CIPROFLOXACIN HCL 250 MG PO TABS: 250.0000 mg | ORAL_TABLET | Freq: Two times a day (BID) | ORAL | Status: AC

## 2011-06-07 MED ORDER — HYDROCODONE-ACETAMINOPHEN 5-500 MG PO TABS: ORAL_TABLET | ORAL | Status: DC

## 2011-06-07 NOTE — Progress Notes (Signed)
Subjective:    Patient ID: Diane Williams, female    DOB: 1950-11-08, 60 y.o.   MRN: [id number]  HPI 60 y/o WF here for a follow up visit... she has mult med issues as noted below... she has been unable to quit smoking despite all our prev conversations & recommendations...  ~  December 22, 2009:  71mo follow up & not feeling well, she says... wonders if its the heat... 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 she is still smoking ~1/2ppd> min cough/ phlegm, etc... last CXR11/10= NAD... she has IC followed by Urology in [place]... persistant FM symptoms> on Amitrip, Vicodin, Tramadol... mod anxiety on Gertie Gowda but wants to ch back to [employer]... OK TDAP today.  ~  June 22, 2010:  6 mo ROV- notes painful rash on upper chest & back on left side c/w shingles x 2d... c/o severe pain- not relieved by Vicodin & Tramadol, lying in fetal position on exam table today... we discussed Rx w/ FAMVIR 500mg Tid, Depo80/ Dosepak, & Duragesic-50...  she just has CSpine surg (ant cerv disckectomy) by DrKritzer 06/02/10...  still smoking 1/2 ppd & requesting smoking cessation help- Rx for Chantix written... due for yearly CXR & blood work but wants to wait due to severe shingles pain... GI followed by DrKaplan & stable IBS symptoms...  IC still followed by Urology in [place]... FM & back pain> she wants Vicodin & Tramadol refilled, & asking to switch the Clorazepate to Ativan for nerves...  ~  August 20, 2010:  [facility]> ruled out for cardiac prob & seen by DrNasher- disch on Percocet, Tramadol, Tylenol, Gabapentin, Amitriptyline... she continues to follow up w/ DrKritzer regarding her CSpine surg... still smoking ~1/2 ppd & intol to Chantix she says... c/o URI congestion & persist  itching from the shingles> we discussed ZPak & Atarax...  ~  Dec 07, 2010:  3mo ROV & she states improved> she's been caring for her 56 y/o father & things have calmed down at home, felling better overall w/ Celexa added;  Requests refills Gabapentin (for post herpetic neuralgia), Librax (for IBS), Vicodin (for back pain & FM) & change Ativan back to Tranxene...  She will return for fasting blood work>>  ~  June 07, 2011:  71mo ROV & she tells me she is sched for cat surg soon; but is c/o feeling sick to her stomach for the past wk, notes some dysuria, low grade fever & HA;  I suggested that she hold off on the Cat surg for now & let us Rx w/ Cipro, and check CXR (biapical pleural thickening, coarse markings, CSpine surg, NAD);  Check B12 (still on low end at 238) & Urine (clear, culture neg);  She needs to quit the smoking & we discussed this again;  She is again asking to change anxiolytics- this time back to Ativan...          Problem List:  IRITIS (ICD-364.3) - see EMR note of 08/30/07 > Iritis followed by [facility]Cohen of [facility]... we did a number of tests in 2009- collagen vasc screen was normal... pt reports she has early cataracts as well and is being followed...  CIGARETTE SMOKER (ICD-305.1) - still smoking 1/2 ppd w/ no interest in quitting.Marland KitchenMarland Kitchen  we discussed smoking cessation strategies, she tried Chantix but states intol w/ nausea...  Hx of BRONCHITIS (ICD-490) - hx recurrent bronchitic infections w/ cough, congestion, sputum production etc... advised to quit smoking, use Mucinex, Fluids, etc... ~  CXR 11/10 was clear, NAD.[name]. ~  CXR 12/11 was clear x sl incr basilar markings...  ~  CXR 11/12 showed biapical pleural thickening, coarse markings, CSpine surg, NAD...  GERD (ICD-530.81) - uses Prn Prilosec...  IRRITABLE BOWEL SYNDROME & DIVERTICULOSIS OF COLON - we discussed trying Activa/ Align/ etc... Prev on Bentyl but insurance won't cover> change to LIBRAX Prn.... ~   colonoscopy Dec 01, 1997 by Dorris Singh showed few divertics... due for f/u colon. ~  she had CTAbd 3/08 w/ diverticulosis seen... ~  colonoscopy for rectal bleeding 9/10 showed +adenomatous polyp removed, divertics and hems (bleeding likely from hems) & Rx w/ Anusol-HC suppos...  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 she had ERCP w/ sphincterotomy and stent~ all benign... no signs of pancreatitis... mod post ERCP pain that was persistant untl the stent was removed... she has PHENERGAN for nausea Prn. ~  she had ERCP & AbdSonar by Dorris Singh 7/10 w/ hx papillary stenosis & stent for sludge (LFTs were norm)- stone fragments seen, dilated ducts, no obstructing lesions seen... ~  11/10: labs showed AlkPhos= 181, SGOT= 52, SGPT= 53... sent to GIi to review. ~  f/u LFT's 1/11 were WNL...  f/u labs 12/11 were WNL...  f/u labs 7/12 were wnl...  Hx of URINARY TRACT INFECTION (ICD-599.0) & INTERSTITIAL CYSTITIS (ICD-595.1) - followed by Urologist DrCope in [place] (& GYN= DrBerliner)... ~  Cysto 7/10- for hematuria & bladder symptoms- mild chr cystitis... on Pyridium. ~  She has persist dysuria symptoms but no signif infection found, treated w/ Cipro & asked to f/u w/ her Urologist...  BACK PAIN, LUMBAR (ICD-724.2) - she takes VICODIN Prn, and TRAMADOL Prn w/ control of her back pain and generalized muscle pain...  FIBROMYALGIA (ICD-729.1) - full eval in 12-01-2001 by DrDeveshwar... still taking AMITRIPYLINE 75mg HS, & CELEXA 20mg /d...  Hx SHINGLES>  She requests GABAPENTIN 100mg  Tid refill for post herpetic neuralgia...  HEADACHE (ICD-784.0) - prev seen by Neuro, DrSchmidt in the past...  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 she has alternated requests for CHLORAZEPATE 7.5mg  Tid & LORAZEPAM 1mg  Tid.   Past Surgical History  Procedure Date    . Appendectomy   . Hysterectomy and right oopherectomy   . Cholecystectomy   . Common bile duct stent by ercp w/sphincterotomy   . Tonsillectomy   . Tubal ligation   . Cspine surgery 05/2010    by Dr. Gerlene Fee    Outpatient Encounter Prescriptions as of 06/07/2011  Medication Sig Dispense Refill  . amitriptyline (ELAVIL) 25 MG tablet Take 3 tablets by mouth at bedtime  90 tablet  5  . citalopram (CELEXA) 20 MG tablet Take 1 tablet (20 mg total) by mouth daily.  30 tablet  5  . clorazepate (TRANXENE) 7.5 MG tablet Take 7.5 mg by mouth 3 (three) times daily.        [name] gabapentin (NEURONTIN) 100 MG capsule Take 1 capsule (100 mg total) by mouth 3 (three) times daily. Start with 1 by mouth at bedtime and increase slowly to 3 tablets at bedtime  90 capsule  5  . HYDROcodone-acetaminophen (VICODIN) 5-500 MG per  tablet 1 tablet 3 times a day as needed for severe pain  90 tablet  1  . Multiple Vitamins-Minerals (WOMENS MULTIVITAMIN PLUS) TABS Take 1 tablet by mouth daily.        . traMADol (ULTRAM) 50 MG tablet Take 1 tablet (50 mg total) by mouth every 8 (eight) hours as needed.  90 tablet  1  . vitamin B-12 (CYANOCOBALAMIN) 1000 MCG tablet Take 1,000 mcg by mouth daily.          Allergies  Allergen Reactions  . Metoclopramide Hcl     REACTION: tremors  . Prochlorperazine Edisylate     REACTION: tremors  . Varenicline Tartrate     REACTION: nausea    Current Medications, Allergies, Past Medical History, Past Surgical History, Family History, and Social History were reviewed in [employer] record.    Review of Systems         See HPI - all other systems neg except as noted... The patient complains of chest pain and dyspnea on exertion.  The patient denies anorexia, fever, weight loss, weight gain, vision loss, decreased hearing, hoarseness, syncope, peripheral edema, prolonged cough, headaches, hemoptysis, abdominal pain, melena, hematochezia, severe  indigestion/heartburn, hematuria, incontinence, muscle weakness, suspicious skin lesions, transient blindness, difficulty walking, depression, unusual weight change, abnormal bleeding, enlarged lymph nodes, and angioedema.    Objective:   Physical Exam     WD, WN, 60 y/o WF in NAD... she is chr ill appearing... GENERAL:  Alert & oriented; pleasant & cooperative... HEENT:  /AT, EOM-full, PERRLA, EACs-clear, TMs-wnl, NOSE-clear, THROAT-clear & wnl. NECK:  Supple w/ fairROM; no JVD; normal carotid impulses w/o bruits; no thyromegaly or nodules palpated; no lymphadenopathy. CHEST:  Clear to P & A; without wheezes/ rales/ or rhonchi heard... Rash c/w shingles left chest wall ~ T3-4 distrib. HEART:  Regular Rhythm; without murmurs/ rubs/ or gallops detected... ABDOMEN:  Soft & nontender no guarding or rebound, BS +x4Q, neg cva tenderness, no masses palp... EXT:  without deformities,  no varicose veins/ venous insuffic/ or edema. NEURO:  CN's intact;  no focal neuro deficits... DERM:  No lesions noted; no rash etc...  RADIOLOGY DATA:  Reviewed in the EPIC EMR & discussed w/ the patient...  LABORATORY DATA:  Reviewed in the EPIC EMR & discussed w/ the patient...   Assessment & Plan:   SMOKER, Hx Bronchitis>  Still smoking, refuses smoking cessation help, no recent bronchitic exac...  IBS>  We will switch to Librax for better insurance coverage, she will f/u w/ GI is symptoms don't abate...  GU>  Followed by Urology in [place]> she has recurrent GU symptoms and will f/u w/ Urology...  Back Pain/ FM/ chr pain syndrome>  She has Vicodin lim to 3/d, and Tramadol/ Neurontin/ etc...  Anxiety/ Depression>  On Celexa, & requests change back to Ativan...   Patient's Medications  New Prescriptions   No medications on file  Previous Medications   MULTIPLE VITAMINS-MINERALS (WOMENS MULTIVITAMIN PLUS) TABS    Take 1 tablet by mouth daily.     VITAMIN B-12 (CYANOCOBALAMIN) 1000 MCG TABLET     Take 1,000 mcg by mouth daily.    Modified Medications   Modified Medication Previous Medication   AMITRIPTYLINE (ELAVIL) 25 MG TABLET amitriptyline (ELAVIL) 25 MG tablet      Take 3 tablets by mouth at bedtime    Take 3 tablets by mouth at bedtime   CITALOPRAM (CELEXA) 20 MG TABLET citalopram (CELEXA) 20 MG tablet  Take 1 tablet (20 mg total) by mouth daily.    Take 1 tablet (20 mg total) by mouth daily.   GABAPENTIN (NEURONTIN) 100 MG CAPSULE gabapentin (NEURONTIN) 100 MG capsule      Take 1 capsule (100 mg total) by mouth 3 (three) times daily. Start with 1 by mouth at bedtime and increase slowly to 3 tablets at bedtime    Take 1 capsule (100 mg total) by mouth 3 (three) times daily. Start with 1 by mouth at bedtime and increase slowly to 3 tablets at bedtime   HYDROCODONE-ACETAMINOPHEN (VICODIN) 5-500 MG PER TABLET HYDROcodone-acetaminophen (VICODIN) 5-500 MG per tablet      1 tablet 3 times a day as needed for severe pain    1 tablet 3 times a day as needed for severe pain   TRAMADOL (ULTRAM) 50 MG TABLET traMADol (ULTRAM) 50 MG tablet      Take 1 tablet (50 mg total) by mouth every 8 (eight) hours as needed.    Take 1 tablet (50 mg total) by mouth every 8 (eight) hours as needed.  Discontinued Medications   CLORAZEPATE (TRANXENE) 7.5 MG TABLET    Take 7.5 mg by mouth 3 (three) times daily.

## 2011-06-07 NOTE — Patient Instructions (Signed)
Today we updated your med list in our EPIC system...    Continue your current medications the same...    We refilled your meds per request & changed the Chlorazepate to Lorazepam as well...  Today we did your follow up CXR, B12 level & a urine culture...    Please call the PHONE TREE in a few days for your results...    Dial [id number] & when prompted enter your patient number followed by the # symbol...    Your patient number is:  [id number]#  We decided to go ahead and treat you for a unrinary infection w/ Cipro to take twice daily...    We will also arrange for a Urology consultation regarding your voiding problems...  Call for any questions...  Let's plan a routine follow up in 6 months.Marland KitchenMarland Kitchen

## 2011-06-08 ENCOUNTER — Telehealth: Payer: Self-pay | Admitting: Pulmonary Disease

## 2011-06-08 ENCOUNTER — Ambulatory Visit: Payer: Self-pay | Admitting: Ophthalmology

## 2011-06-08 LAB — URINE CULTURE

## 2011-06-08 NOTE — Telephone Encounter (Signed)
I spoke with pt and she states she forgot to ask Dr. Kriste Basque a couple of questions when she was in to see him yesterday. She wanted to know if Dr. Kriste Basque would put her on an anti depressant called ludiomil. She states she was on this many years ago and it helped pull her out of some hard times. Pt states she is very stressed bc she has to help take care of her father. Pt is also wanting to know if Dr. Kriste Basque would change her vicodin to oxycodone 5 mg. Pt states she wants to see how she would do bc she does not want to be taking to much tylenol. Pt stated if Dr. Kriste Basque did this then she would promise to quit smoking. Please advise Dr. Kriste Basque, thanks

## 2011-06-08 NOTE — Telephone Encounter (Signed)
Called and spoke with pt--per SN --- he is not able to prescribe the ludiomil---this will have to be given by psychiatry---pt is aware.  She is also aware that the vicodin if taken 3 times a day does not have a lot of tylenol in it.  She is aware that in order to change to the oxycodone 5mg   She will have to come to the office to pick this rx up every month. Pt stated that she would like to try this x 1 month to see if this helps her out more than the vicodin since she is caring for her father and is up and down the steps all the time.  i explained to the pt that SN is gone for the evening but i will call her back in the morning and let her know about the rx.

## 2011-06-09 MED ORDER — OXYCODONE HCL 5 MG PO CAPS: 5.0000 mg | ORAL_CAPSULE | Freq: Three times a day (TID) | ORAL | Status: AC | PRN

## 2011-06-09 NOTE — Telephone Encounter (Signed)
Per SN---ok to give rx of oxycodone 5mg   1 tid prn pain---this has been left up front for pt to pick up today---called the pharmacy and cancelled all refills of the vicodin 5/500.  The pharmacy stated that they will make a note in her chart of the change.  Called and spoke with pt and she is aware to come by and pick this rx up.

## 2011-06-09 NOTE — Telephone Encounter (Signed)
Pt called to check on status of Rx.  Pt can be reached at [phone].  Diane Williams

## 2011-06-27 ENCOUNTER — Encounter: Payer: Self-pay | Admitting: Pulmonary Disease

## 2011-07-08 ENCOUNTER — Telehealth: Payer: Self-pay | Admitting: Pulmonary Disease

## 2011-07-08 MED ORDER — OXYCODONE HCL 5 MG PO TABS: 5.0000 mg | ORAL_TABLET | Freq: Three times a day (TID) | ORAL | Status: DC | PRN

## 2011-07-08 NOTE — Telephone Encounter (Signed)
Pt requesting refill on oxycodone 5 mg tid- she wants to have a 90 day supply which would be #270 tablets. Please advise if this is okay, thanks!

## 2011-07-08 NOTE — Telephone Encounter (Signed)
rx has been signed by SN and i called and spoke with pt and she is aware of rx up front and ready to be picked up.

## 2011-07-08 NOTE — Telephone Encounter (Signed)
We can only give #90 for the oxycodone 5 mg  Tid with 5 refills.  thanks

## 2011-07-08 NOTE — Telephone Encounter (Signed)
I printed rx for SN to sign- could not do 5 refills only 0. Please call pt when rx is ready for pick up, thanks

## 2011-07-08 NOTE — Telephone Encounter (Signed)
Pt called back again. Diane Williams °

## 2011-08-10 ENCOUNTER — Telehealth: Payer: Self-pay | Admitting: Pulmonary Disease

## 2011-08-10 MED ORDER — OXYCODONE HCL 5 MG PO TABS: 5.0000 mg | ORAL_TABLET | Freq: Three times a day (TID) | ORAL | Status: DC | PRN

## 2011-08-10 NOTE — Telephone Encounter (Signed)
Called and spoke with pt and she is aware of rx that has been placed up front for her to pick up.

## 2011-08-10 NOTE — Telephone Encounter (Signed)
rx has been printed. Placed on SN cart to be signed.  Will call pt once this has been done.

## 2011-08-10 NOTE — Telephone Encounter (Signed)
Per SN--ok to refill.  1 po tid prn pain not to exceed 3 per day  #90.

## 2011-08-10 NOTE — Telephone Encounter (Signed)
Pt requesting refill on oxycodone 5 mg #90. Pt was last given this 07/07/12. Please advise if okay Dr. Kriste Basque, Thermon Leyland

## 2011-09-09 ENCOUNTER — Telehealth: Payer: Self-pay | Admitting: Pulmonary Disease

## 2011-09-09 MED ORDER — OXYCODONE HCL 5 MG PO TABS: 5.0000 mg | ORAL_TABLET | Freq: Three times a day (TID) | ORAL | Status: DC | PRN

## 2011-09-09 NOTE — Telephone Encounter (Signed)
Pt last saw SN 06/07/11 and has pending appt 12/06/11.  Last had rx filled 08/10/11 for # 90 x 0 refills.  SN, please advise if ok to refill or not.  Thanks!

## 2011-09-09 NOTE — Telephone Encounter (Signed)
Ok per [facility] for # 90 x 0 refills.     Rx signed by SN and at front desk for pt to pick up.  Pt aware.

## 2011-10-07 ENCOUNTER — Telehealth: Payer: Self-pay | Admitting: Pulmonary Disease

## 2011-10-07 MED ORDER — OXYCODONE HCL 5 MG PO TABS: 5.0000 mg | ORAL_TABLET | Freq: Three times a day (TID) | ORAL | Status: DC | PRN

## 2011-10-07 NOTE — Telephone Encounter (Signed)
Pt last saw SN 06/07/11 and has pending appt 12/06/11. Last had rx filled 09/09/11 for # 90 x 0 refills. SN, please advise if ok to refill or not. Thanks!

## 2011-10-07 NOTE — Telephone Encounter (Signed)
SN has signed rx and i have placed this upfront for pick up. Pt is aware of this and needed nothing further

## 2011-10-07 NOTE — Telephone Encounter (Signed)
Per SN okay to refill #90. I have printed off rx and placed on SN cart for signature

## 2011-11-05 ENCOUNTER — Telehealth: Payer: Self-pay | Admitting: Pulmonary Disease

## 2011-11-05 MED ORDER — OXYCODONE HCL 5 MG PO TABS: 5.0000 mg | ORAL_TABLET | Freq: Three times a day (TID) | ORAL | Status: DC | PRN

## 2011-11-05 NOTE — Telephone Encounter (Signed)
Pt is requesting a refill on her oxycodone. Pt last given this 10/07/11 #90 x 0 refills. Last OV 06/07/11 and f/u in 6 months. Please advise SN thanks

## 2011-11-05 NOTE — Telephone Encounter (Signed)
Pt would like to know if she will be able to pick up this Rx on Monday, 11/08/11.  Please advise.    Antionette Fairy

## 2011-11-05 NOTE — Telephone Encounter (Signed)
Called and spoke with pt and she is aware of rx left up front for her to pick this up. Nothing further needed.

## 2011-11-24 ENCOUNTER — Ambulatory Visit: Payer: Self-pay | Admitting: Ophthalmology

## 2011-11-24 ENCOUNTER — Telehealth: Payer: Self-pay | Admitting: Pulmonary Disease

## 2011-11-24 MED ORDER — LORAZEPAM 1 MG PO TABS: 1.0000 mg | ORAL_TABLET | Freq: Three times a day (TID) | ORAL | Status: AC | PRN

## 2011-11-24 NOTE — Telephone Encounter (Signed)
Per SN---ok to refill this time---pt has pending appt on 5/20 and she will need to keep this appt for further refills.  rx has been called to the pharmacy

## 2011-11-24 NOTE — Telephone Encounter (Signed)
This med is not on pts med list.  It is mentioned in SN last ov note.  Will check with SN to make sure ok to refill and will call in if ok.

## 2011-12-06 ENCOUNTER — Encounter: Payer: Self-pay | Admitting: Pulmonary Disease

## 2011-12-06 ENCOUNTER — Ambulatory Visit (INDEPENDENT_AMBULATORY_CARE_PROVIDER_SITE_OTHER): Payer: Medicare Other | Admitting: Pulmonary Disease

## 2011-12-06 VITALS — BP 102/62 | HR 81 | Temp 98.8°F | Ht 65.5 in | Wt 146.4 lb

## 2011-12-06 DIAGNOSIS — M545 Low back pain: Secondary | ICD-10-CM

## 2011-12-06 DIAGNOSIS — K5730 Diverticulosis of large intestine without perforation or abscess without bleeding: Secondary | ICD-10-CM

## 2011-12-06 DIAGNOSIS — F341 Dysthymic disorder: Secondary | ICD-10-CM

## 2011-12-06 DIAGNOSIS — K589 Irritable bowel syndrome without diarrhea: Secondary | ICD-10-CM

## 2011-12-06 DIAGNOSIS — F17200 Nicotine dependence, unspecified, uncomplicated: Secondary | ICD-10-CM

## 2011-12-06 DIAGNOSIS — K219 Gastro-esophageal reflux disease without esophagitis: Secondary | ICD-10-CM

## 2011-12-06 DIAGNOSIS — R51 Headache: Secondary | ICD-10-CM

## 2011-12-06 DIAGNOSIS — N3010 Interstitial cystitis (chronic) without hematuria: Secondary | ICD-10-CM

## 2011-12-06 DIAGNOSIS — IMO0001 Reserved for inherently not codable concepts without codable children: Secondary | ICD-10-CM

## 2011-12-06 DIAGNOSIS — B029 Zoster without complications: Secondary | ICD-10-CM

## 2011-12-06 DIAGNOSIS — K839 Disease of biliary tract, unspecified: Secondary | ICD-10-CM

## 2011-12-06 DIAGNOSIS — L237 Allergic contact dermatitis due to plants, except food: Secondary | ICD-10-CM

## 2011-12-06 DIAGNOSIS — L255 Unspecified contact dermatitis due to plants, except food: Secondary | ICD-10-CM

## 2011-12-06 MED ORDER — METHYLPREDNISOLONE 4 MG PO KIT: PACK | ORAL | Status: AC

## 2011-12-06 MED ORDER — TRAMADOL HCL 50 MG PO TABS: 50.0000 mg | ORAL_TABLET | Freq: Three times a day (TID) | ORAL | Status: DC | PRN

## 2011-12-06 MED ORDER — LORAZEPAM 1 MG PO TABS: 1.0000 mg | ORAL_TABLET | Freq: Three times a day (TID) | ORAL | Status: AC | PRN

## 2011-12-06 MED ORDER — METHYLPREDNISOLONE ACETATE 80 MG/ML IJ SUSP
80.0000 mg | Freq: Once | INTRAMUSCULAR | Status: AC
  Administered 2011-12-06: 80 mg via INTRAMUSCULAR

## 2011-12-06 MED ORDER — AMITRIPTYLINE HCL 25 MG PO TABS: ORAL_TABLET | ORAL | Status: DC

## 2011-12-06 MED ORDER — GABAPENTIN 100 MG PO CAPS: 100.0000 mg | ORAL_CAPSULE | Freq: Three times a day (TID) | ORAL | Status: DC

## 2011-12-06 MED ORDER — OXYCODONE HCL 5 MG PO TABS: 5.0000 mg | ORAL_TABLET | Freq: Three times a day (TID) | ORAL | Status: DC | PRN

## 2011-12-06 NOTE — Patient Instructions (Signed)
Today we updated your med list in our EPIC system...    Continue your current medications the same...    We refilled your meds per request...  Be sure to re-start the Vit B12 supplement daily...  Let's plan a follow up visit in about 6 months w/ FASTING blood work around that time.Marland KitchenMarland Kitchen

## 2011-12-06 NOTE — Progress Notes (Signed)
Subjective:    Patient ID: Diane Williams, female    DOB: 05-10-1951, 61 y.o.   MRN: [id number]  HPI 61 y/o WF here for a follow up visit... she has mult med issues as noted below... she has been unable to quit smoking despite all our prev conversations & recommendations...  ~  December 22, 2009:  28mo follow up & not feeling well, she says... wonders if its the heat... 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 she is still smoking ~1/2ppd> min cough/ phlegm, etc... last CXR11/10= NAD... she has IC followed by Urology in [employer]... persistant FM symptoms> on Amitrip, Vicodin, Tramadol... mod anxiety on Gertie Gowda but wants to ch back to [employer]... OK TDAP today.  ~  June 22, 2010:  6 mo ROV- notes painful rash on upper chest & back on left side c/w shingles x 2d... c/o severe pain- not relieved by Vicodin & Tramadol, lying in fetal position on exam table today... we discussed Rx w/ FAMVIR 500mg Tid, Depo80/ Dosepak, & Duragesic-50...  she just has CSpine surg (ant cerv disckectomy) by DrKritzer 06/02/10...  still smoking 1/2 ppd & requesting smoking cessation help- Rx for Chantix written... due for yearly CXR & blood work but wants to wait due to severe shingles pain... GI followed by DrKaplan & stable IBS symptoms...  IC still followed by Urology in [place]... FM & back pain> she wants Vicodin & Tramadol refilled, & asking to switch the Clorazepate to Ativan for nerves...  ~  August 20, 2010:  she [facility]> ruled out for cardiac prob & seen by DrNasher- disch on Percocet, Tramadol, Tylenol, Gabapentin, Amitriptyline... she continues to follow up w/ DrKritzer regarding her CSpine surg... still smoking ~1/2 ppd & intol to Chantix she says... c/o URI congestion & persist  itching from the shingles> we discussed ZPak & Atarax...  ~  Dec 07, 2010:  69mo ROV & she states improved> she's been caring for her 51 y/o father & things have calmed down at home, felling better overall w/ Celexa added;  Requests refills Gabapentin (for post herpetic neuralgia), Librax (for IBS), Vicodin (for back pain & FM) & change Ativan back to Tranxene...  She will return for fasting blood work>>  ~  June 07, 2011:  28mo ROV & she tells me she is sched for cat surg soon; but is c/o feeling sick to her stomach for the past wk, notes some dysuria, low grade fever & HA;  I suggested that she hold off on the Cat surg for now & let us Rx w/ Cipro, and check CXR (biapical pleural thickening, coarse markings, CSpine surg, NAD);  Check B12 (still on low end at 238) & Urine (clear, culture neg);  She needs to quit the smoking & we discussed this again;  She is again asking to change anxiolytics- this time back to Ativan...  ~  Dec 06, 2011:  28mo ROV & Ericha Drohan is c/o recent poison oak exposure, treated OTC and improved, we discussed Depo/ Dosepak;  She is still smoking 1/2 ppd & shows no interest in smoking cessation help etc; there have been mult phone calls for refills of her Oxycodone & Lorazepam... We reviewed your prob list, meds, xrays and labs> see below>> she stopped her B12  oral supplement by mistake & she will restart this now; she feels the Amitrip really helps her rest; meds refilled today...          Problem List:  IRITIS (ICD-364.3) - see EMR note of 08/30/07 > I[facility]hen [facility]... we did a number of tests in 12-21-2007- collagen vasc screen was normal... pt reports she has early cataracts as well and is being followed...  CIGARETTE SMOKER (ICD-305.1) - still smoking 1/2 ppd w/ no interest in quitting...  we discussed smoking cessation strategies, she tried Chantix but states intol w/ nausea...  Hx of BRONCHITIS (ICD-490) - hx recurrent bronchitic infections  w/ cough, congestion, sputum production etc... advised to quit smoking, use Mucinex, Fluids, etc... ~  CXR 11/10 was clear, NAD.[name]. ~  CXR 12/11 was clear x sl incr basilar markings...  ~  CXR 11/12 showed biapical pleural thickening, coarse markings, CSpine surg, NAD...  GERD (ICD-530.81) - uses Prn Prilosec...  IRRITABLE BOWEL SYNDROME & DIVERTICULOSIS OF COLON - we discussed trying Activa/ Align/ etc... Prev on Bentyl but insurance won't cover> change to LIBRAX Prn.... ~  colonoscopy 1997-12-20 by Dorris Singh showed few divertics... due for f/u colon. ~  she had CTAbd 3/08 w/ diverticulosis seen... ~  colonoscopy for rectal bleeding 9/10 showed +adenomatous polyp removed, divertics and hems (bleeding likely from hems) & Rx w/ Anusol-HC suppos...  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 she had ERCP w/ sphincterotomy and stent~ all benign... no signs of pancreatitis... mod post ERCP pain that was persistant untl the stent was removed... she has PHENERGAN for nausea Prn. ~  she had ERCP & AbdSonar by Dorris Singh 7/10 w/ hx papillary stenosis & stent for sludge (LFTs were norm)- stone fragments seen, dilated ducts, no obstructing lesions seen... ~  11/10: labs showed AlkPhos= 181, SGOT= 52, SGPT= 53... sent to GIi to review. ~  f/u LFT's 1/11 were WNL...  f/u labs 12/11 were WNL...  f/u labs 7/12 were wnl...  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 ~  Cysto 7/10- for hematuria & bladder symptoms- mild chr cystitis... on Pyridium. ~  She has persist dysuria symptoms but no signif infection found, treated w/ Cipro & asked to f/u w/ her Urologist...  BACK PAIN, LUMBAR (ICD-724.2) - she takes VICODIN Prn, and TRAMADOL Prn w/ control of her  back pain and generalized muscle pain...  FIBROMYALGIA (ICD-729.1) - full eval in 12/20/01 by DrDeveshwar... still taking AMITRIPYLINE 75mg HS, & CELEXA 20mg /d...  Hx SHINGLES>  She requests GABAPENTIN 100mg  Tid refill for post herpetic neuralgia...  HEADACHE (ICD-784.0) - prev seen by Neuro, DrSchmidt in the past...  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 she has alternated requests for CHLORAZEPATE 7.5mg  Tid & LORAZEPAM 1mg  Tid.   Past Surgical History  Procedure Date  . Appendectomy   . Hysterectomy and right oopherectomy   . Cholecystectomy   . Common bile duct stent by ercp w/sphincterotomy   . Tonsillectomy   . Tubal ligation   . Cspine surgery 05/2010    by Dr. Gerlene Fee    Outpatient Encounter Prescriptions as of 12/06/2011  Medication Sig Dispense Refill  . amitriptyline (ELAVIL) 25 MG tablet Take 3 tablets by mouth at bedtime  90 tablet  5  .  gabapentin (NEURONTIN) 100 MG capsule Take 1 capsule (100 mg total) by mouth 3 (three) times daily. Start with 1 by mouth at bedtime and increase slowly to 3 tablets at bedtime  90 capsule  5  . Multiple Vitamins-Minerals (WOMENS MULTIVITAMIN PLUS) TABS Take 1 tablet by mouth daily.        [name] oxyCODONE (ROXICODONE) 5 MG immediate release tablet Take 1 tablet (5 mg total) by mouth 3 (three) times daily as needed for pain.  90 tablet  0  . traMADol (ULTRAM) 50 MG tablet Take 1 tablet (50 mg total) by mouth every 8 (eight) hours as needed.  90 tablet  5  . DISCONTD: citalopram (CELEXA) 20 MG tablet Take 1 tablet (20 mg total) by mouth daily.  30 tablet  5  . DISCONTD: HYDROcodone-acetaminophen (VICODIN) 5-500 MG per tablet 1 tablet 3 times a day as needed for severe pain  90 tablet  5  . DISCONTD: vitamin B-12 (CYANOCOBALAMIN) 1000 MCG tablet Take 1,000 mcg by mouth daily.          Allergies  Allergen Reactions  . Metoclopramide Hcl     REACTION: tremors  . Prochlorperazine Edisylate     REACTION: tremors  . Varenicline Tartrate       REACTION: nausea    Current Medications, Allergies, Past Medical History, Past Surgical History, Family History, and Social History were reviewed in [employer] record.    Review of Systems         See HPI - all other systems neg except as noted... The patient complains of chest pain and dyspnea on exertion.  The patient denies anorexia, fever, weight loss, weight gain, vision loss, decreased hearing, hoarseness, syncope, peripheral edema, prolonged cough, headaches, hemoptysis, abdominal pain, melena, hematochezia, severe indigestion/heartburn, hematuria, incontinence, muscle weakness, suspicious skin lesions, transient blindness, difficulty walking, depression, unusual weight change, abnormal bleeding, enlarged lymph nodes, and angioedema.    Objective:   Physical Exam     WD, WN, 61 y/o WF in NAD... she is chr ill appearing... GENERAL:  Alert & oriented; pleasant & cooperative... HEENT:  [place]/AT, EOM-full, PERRLA, EACs-clear, TMs-wnl, NOSE-clear, THROAT-clear & wnl. NECK:  Supple w/ fairROM; no JVD; normal carotid impulses w/o bruits; no thyromegaly or nodules palpated; no lymphadenopathy. CHEST:  Clear to P & A; without wheezes/ rales/ or rhonchi heard... Rash c/w shingles left chest wall ~ T3-4 distrib. HEART:  Regular Rhythm; without murmurs/ rubs/ or gallops detected... ABDOMEN:  Soft & nontender no guarding or rebound, BS +x4Q, neg cva tenderness, no masses palp... EXT:  without deformities,  no varicose veins/ venous insuffic/ or edema. NEURO:  CN's intact;  no focal neuro deficits... DERM:  No lesions noted; no rash etc...  RADIOLOGY DATA:  Reviewed in the EPIC EMR & discussed w/ the patient...  LABORATORY DATA:  Reviewed in the EPIC EMR & discussed w/ the patient...   Assessment & Plan:   Poison Oak>  We will Rx w/ Depo/ Dosepak...  SMOKER, Hx Bronchitis>  Still smoking, refuses smoking cessation help, no recent bronchitic exac...  IBS>  We  will switch to Librax for better insurance coverage, she will f/u w/ GI is symptoms don't abate...  GU>  Followed by Urology in [place]> she has recurrent GU symptoms and will f/u w/ Urology...  Back Pain/ FM/ chr pain syndrome>  She has Vicodin lim to 3/d, and Tramadol/ Neurontin/ etc...  Anxiety/ Depression>  On Celexa, & requests change back to Ativan.Marland KitchenMarland Kitchen  Patient's Medications  New Prescriptions   No medications on file  Previous Medications   AMITRIPTYLINE (ELAVIL) 25 MG TABLET    Take 3 tablets by mouth at bedtime   GABAPENTIN (NEURONTIN) 100 MG CAPSULE    Take 1 capsule (100 mg total) by mouth 3 (three) times daily. Start with 1 by mouth at bedtime and increase slowly to 3 tablets at bedtime   MULTIPLE VITAMINS-MINERALS (WOMENS MULTIVITAMIN PLUS) TABS    Take 1 tablet by mouth daily.     OXYCODONE (ROXICODONE) 5 MG IMMEDIATE RELEASE TABLET    Take 1 tablet (5 mg total) by mouth 3 (three) times daily as needed for pain.   TRAMADOL (ULTRAM) 50 MG TABLET    Take 1 tablet (50 mg total) by mouth every 8 (eight) hours as needed.  Modified Medications   No medications on file  Discontinued Medications   CITALOPRAM (CELEXA) 20 MG TABLET    Take 1 tablet (20 mg total) by mouth daily.   HYDROCODONE-ACETAMINOPHEN (VICODIN) 5-500 MG PER TABLET    1 tablet 3 times a day as needed for severe pain   VITAMIN B-12 (CYANOCOBALAMIN) 1000 MCG TABLET    Take 1,000 mcg by mouth daily.

## 2011-12-07 ENCOUNTER — Ambulatory Visit: Payer: Self-pay | Admitting: Ophthalmology

## 2012-01-01 ENCOUNTER — Emergency Department: Payer: Self-pay | Admitting: Emergency Medicine

## 2012-01-05 ENCOUNTER — Telehealth: Payer: Self-pay | Admitting: Pulmonary Disease

## 2012-01-05 MED ORDER — OXYCODONE HCL 5 MG PO TABS: 5.0000 mg | ORAL_TABLET | Freq: Three times a day (TID) | ORAL | Status: DC | PRN

## 2012-01-05 NOTE — Telephone Encounter (Signed)
Leigh, please have SN advise if okay to refill and how many additional refills to give. Pt was last seen 12-06-11; Thanks.

## 2012-01-05 NOTE — Telephone Encounter (Signed)
rx has been left up front and ready to be picked up.  thanks

## 2012-01-05 NOTE — Telephone Encounter (Signed)
Called spoke with patient, informed her that the rx is ready to be picked up.  Pt verbalized her understanding.

## 2012-01-05 NOTE — Telephone Encounter (Signed)
Pt son came by to pick up rx, informed him it was not ready yet. We will call pt when ready.

## 2012-01-31 ENCOUNTER — Telehealth: Payer: Self-pay | Admitting: Pulmonary Disease

## 2012-01-31 MED ORDER — CIPROFLOXACIN HCL 500 MG PO TABS: 500.0000 mg | ORAL_TABLET | Freq: Two times a day (BID) | ORAL | Status: AC

## 2012-01-31 NOTE — Telephone Encounter (Signed)
Per SN---increase fluid intake (water), and call in cipro 500mg   #14  1 po bid until gone.  Thanks.

## 2012-01-31 NOTE — Telephone Encounter (Signed)
LMTCB rx was sent to [facility]

## 2012-01-31 NOTE — Telephone Encounter (Signed)
LMOMTCB x1 for pt 

## 2012-01-31 NOTE — Telephone Encounter (Signed)
Pt wants to switch oxycodone to Vicodin/ please send rx to same pharm. Pt knows her other rx was already sent to pharm.

## 2012-01-31 NOTE — Telephone Encounter (Signed)
Called, spoke with pt who c/o burning sensation when urinating, HA, pain under right rib cage and into back, and temp 100.  Also, reports urine has a strong odor and is dark in color.  Symptoms started on Friday evening.  States these are same sxs she's had before with UTI -- requesting recs.  Dr. Kriste Basque, pls advise.  Thank you.  [facility]  Allergies verified:  Allergies  Allergen Reactions  . Metoclopramide Hcl     REACTION: tremors  . Prochlorperazine Edisylate     REACTION: tremors  . Varenicline Tartrate     REACTION: nausea

## 2012-02-02 NOTE — Telephone Encounter (Signed)
Called number-daughter of patient's number-she states she is not with patient at this time and patients phone is not working at this time; will have patient call us back asap.

## 2012-02-03 MED ORDER — OXYCODONE HCL 5 MG PO TABS: 5.0000 mg | ORAL_TABLET | Freq: Three times a day (TID) | ORAL | Status: DC | PRN

## 2012-02-03 NOTE — Telephone Encounter (Signed)
Spoke with pt made aware Rx is ready to pick up Also, called pharmacy to [facility] Vicodin pharmacy states that Rx for the Vicodin wasn't called.

## 2012-02-03 NOTE — Telephone Encounter (Signed)
rx has been left up front for the pt to pick up.  We need to cancel the rx for the vicodin and she can have the rx for the oxy.  This rx has been left up front for the pt.  thanks

## 2012-02-03 NOTE — Telephone Encounter (Signed)
Spoke with pt requesting that the oxycodone 5mg  be wrote for this month,instead of the Vicodin.pt wasn't sure if she would be able to come an pick up the rx for the oxy.Pt would like to pick this up today at 1pm. rx printed and sent to [facility] to sign.

## 2012-03-03 ENCOUNTER — Telehealth: Payer: Self-pay | Admitting: Pulmonary Disease

## 2012-03-03 MED ORDER — OXYCODONE HCL 5 MG PO TABS: 5.0000 mg | ORAL_TABLET | Freq: Three times a day (TID) | ORAL | Status: DC | PRN

## 2012-03-03 NOTE — Telephone Encounter (Signed)
Pt calling again in ref to previous msg says she needs to p/u today because she has a ride today can be reached at [phone].Raylene Everts

## 2012-03-03 NOTE — Telephone Encounter (Signed)
Per SN---ok to give refill.  rx has been printed out and left up front for the pt to pick up.  lmom to make the pt aware that rx is ready. Nothing further is needed.

## 2012-03-03 NOTE — Telephone Encounter (Signed)
Pt called back wanting an update on this rx  Leanora Ivanoff

## 2012-03-03 NOTE — Telephone Encounter (Signed)
Pt is requesting a refill on oxycodone 5 mg 1 po TID. Last refilled 02/03/12 #90 x 0 refills. Last OV 12/06/11 and told to f/u in 6 months. Pt would like to pick up rx. thanks

## 2012-03-03 NOTE — Telephone Encounter (Signed)
Spoke with the pt and advised we have the message with Dr. Kriste Basque nurse and that he is seeing patients but when he gets a chance he will sign the rx and we will notify her. Carron Curie, CMA

## 2012-03-31 ENCOUNTER — Telehealth: Payer: Self-pay | Admitting: Pulmonary Disease

## 2012-03-31 MED ORDER — OXYCODONE HCL 5 MG PO TABS: 5.0000 mg | ORAL_TABLET | Freq: Three times a day (TID) | ORAL | Status: DC | PRN

## 2012-03-31 NOTE — Telephone Encounter (Signed)
rx has been signed by SN and left up front for pt to pick up on Monday.  Pt is aware and nothing further is needed.

## 2012-03-31 NOTE — Telephone Encounter (Signed)
rx has been printed out for SN to sign.  Will place up front for the pt to pick up on Monday.

## 2012-05-02 ENCOUNTER — Telehealth: Payer: Self-pay | Admitting: Pulmonary Disease

## 2012-05-02 MED ORDER — OXYCODONE HCL 5 MG PO TABS: 5.0000 mg | ORAL_TABLET | Freq: Three times a day (TID) | ORAL | Status: DC | PRN

## 2012-05-02 NOTE — Telephone Encounter (Signed)
rx has been signed by SN and placed up front for pt to pick up.  Called and spoke with pt and she is aware.

## 2012-05-02 NOTE — Telephone Encounter (Signed)
RX has been signed and placed on SN cart for signature.

## 2012-05-24 ENCOUNTER — Telehealth: Payer: Self-pay | Admitting: Pulmonary Disease

## 2012-05-24 MED ORDER — AMITRIPTYLINE HCL 25 MG PO TABS: ORAL_TABLET | ORAL | Status: DC

## 2012-05-24 MED ORDER — GABAPENTIN 100 MG PO CAPS: 100.0000 mg | ORAL_CAPSULE | Freq: Three times a day (TID) | ORAL | Status: DC

## 2012-05-24 NOTE — Telephone Encounter (Signed)
Pt aware that I will call these into her pharmacy.

## 2012-06-02 ENCOUNTER — Encounter: Payer: Self-pay | Admitting: *Deleted

## 2012-06-05 ENCOUNTER — Other Ambulatory Visit (INDEPENDENT_AMBULATORY_CARE_PROVIDER_SITE_OTHER): Payer: Medicare Other

## 2012-06-05 ENCOUNTER — Ambulatory Visit (INDEPENDENT_AMBULATORY_CARE_PROVIDER_SITE_OTHER)
Admission: RE | Admit: 2012-06-05 | Discharge: 2012-06-05 | Disposition: A | Discharge status: Home / Self Care | Payer: Medicaid Other | Source: Ambulatory Visit | Attending: Pulmonary Disease | Admitting: Pulmonary Disease

## 2012-06-05 ENCOUNTER — Ambulatory Visit (INDEPENDENT_AMBULATORY_CARE_PROVIDER_SITE_OTHER): Payer: Medicare Other | Admitting: Pulmonary Disease

## 2012-06-05 ENCOUNTER — Encounter: Payer: Self-pay | Admitting: Pulmonary Disease

## 2012-06-05 ENCOUNTER — Telehealth: Payer: Self-pay | Admitting: Pulmonary Disease

## 2012-06-05 VITALS — BP 108/78 | HR 82 | Temp 97.4°F | Ht 65.5 in | Wt 151.2 lb

## 2012-06-05 DIAGNOSIS — Z79899 Other long term (current) drug therapy: Secondary | ICD-10-CM

## 2012-06-05 DIAGNOSIS — K839 Disease of biliary tract, unspecified: Secondary | ICD-10-CM

## 2012-06-05 DIAGNOSIS — M545 Low back pain, unspecified: Secondary | ICD-10-CM

## 2012-06-05 DIAGNOSIS — F411 Generalized anxiety disorder: Secondary | ICD-10-CM

## 2012-06-05 DIAGNOSIS — K219 Gastro-esophageal reflux disease without esophagitis: Secondary | ICD-10-CM

## 2012-06-05 DIAGNOSIS — F419 Anxiety disorder, unspecified: Secondary | ICD-10-CM

## 2012-06-05 DIAGNOSIS — K5730 Diverticulosis of large intestine without perforation or abscess without bleeding: Secondary | ICD-10-CM

## 2012-06-05 DIAGNOSIS — E538 Deficiency of other specified B group vitamins: Secondary | ICD-10-CM

## 2012-06-05 DIAGNOSIS — B029 Zoster without complications: Secondary | ICD-10-CM

## 2012-06-05 DIAGNOSIS — I872 Venous insufficiency (chronic) (peripheral): Secondary | ICD-10-CM

## 2012-06-05 DIAGNOSIS — E7800 Pure hypercholesterolemia, unspecified: Secondary | ICD-10-CM

## 2012-06-05 DIAGNOSIS — J40 Bronchitis, not specified as acute or chronic: Secondary | ICD-10-CM

## 2012-06-05 DIAGNOSIS — F341 Dysthymic disorder: Secondary | ICD-10-CM

## 2012-06-05 DIAGNOSIS — F17200 Nicotine dependence, unspecified, uncomplicated: Secondary | ICD-10-CM

## 2012-06-05 DIAGNOSIS — K589 Irritable bowel syndrome without diarrhea: Secondary | ICD-10-CM

## 2012-06-05 DIAGNOSIS — N3010 Interstitial cystitis (chronic) without hematuria: Secondary | ICD-10-CM

## 2012-06-05 DIAGNOSIS — IMO0001 Reserved for inherently not codable concepts without codable children: Secondary | ICD-10-CM

## 2012-06-05 DIAGNOSIS — B0229 Other postherpetic nervous system involvement: Secondary | ICD-10-CM

## 2012-06-05 DIAGNOSIS — R51 Headache: Secondary | ICD-10-CM

## 2012-06-05 LAB — BASIC METABOLIC PANEL
CO2: 29 mEq/L (ref 19–32)
Calcium: 9.2 mg/dL (ref 8.4–10.5)
Chloride: 102 mEq/L (ref 96–112)
Creatinine, Ser: 0.6 mg/dL (ref 0.4–1.2)
Sodium: 138 mEq/L (ref 135–145)

## 2012-06-05 LAB — HEPATIC FUNCTION PANEL
Alkaline Phosphatase: 126 U/L — ABNORMAL HIGH (ref 39–117)
Bilirubin, Direct: 0.1 mg/dL (ref 0.0–0.3)
Total Bilirubin: 0.4 mg/dL (ref 0.3–1.2)
Total Protein: 7.2 g/dL (ref 6.0–8.3)

## 2012-06-05 LAB — LIPID PANEL
Cholesterol: 209 mg/dL — ABNORMAL HIGH (ref 0–200)
Total CHOL/HDL Ratio: 3
VLDL: 14.6 mg/dL (ref 0.0–40.0)

## 2012-06-05 LAB — TSH: TSH: 1.55 u[IU]/mL (ref 0.35–5.50)

## 2012-06-05 LAB — CBC WITH DIFFERENTIAL/PLATELET
Basophils Absolute: 0 10*3/uL (ref 0.0–0.1)
Basophils Relative: 0.5 % (ref 0.0–3.0)
Eosinophils Absolute: 0.3 10*3/uL (ref 0.0–0.7)
Hemoglobin: 13.9 g/dL (ref 12.0–15.0)
Lymphs Abs: 2.1 10*3/uL (ref 0.7–4.0)
MCHC: 32.7 g/dL (ref 30.0–36.0)
MCV: 93.3 fl (ref 78.0–100.0)
Monocytes Absolute: 0.6 10*3/uL (ref 0.1–1.0)
Neutro Abs: 3.7 10*3/uL (ref 1.4–7.7)
RBC: 4.57 Mil/uL (ref 3.87–5.11)
RDW: 13.7 % (ref 11.5–14.6)

## 2012-06-05 LAB — VITAMIN B12: Vitamin B-12: 740 pg/mL (ref 211–911)

## 2012-06-05 LAB — LDL CHOLESTEROL, DIRECT: Direct LDL: 123.7 mg/dL

## 2012-06-05 MED ORDER — TRAMADOL HCL 50 MG PO TABS: 50.0000 mg | ORAL_TABLET | Freq: Three times a day (TID) | ORAL | Status: DC | PRN

## 2012-06-05 MED ORDER — GABAPENTIN 100 MG PO CAPS: 100.0000 mg | ORAL_CAPSULE | Freq: Every day | ORAL | Status: DC

## 2012-06-05 MED ORDER — AMITRIPTYLINE HCL 25 MG PO TABS: ORAL_TABLET | ORAL | Status: DC

## 2012-06-05 MED ORDER — LORAZEPAM 0.5 MG PO TABS: 0.5000 mg | ORAL_TABLET | Freq: Three times a day (TID) | ORAL | Status: DC | PRN

## 2012-06-05 MED ORDER — OXYCODONE HCL 5 MG PO TABS: 5.0000 mg | ORAL_TABLET | Freq: Three times a day (TID) | ORAL | Status: DC | PRN

## 2012-06-05 NOTE — Telephone Encounter (Signed)
SN pt is requesting a refill of the lorazepam---she did not mention this medication at her ov.  i see in your last note that she alternates between the clorazepate and the lorazepam.  Is this ok to refill for her.  Please advise. Thanks   Allergies  Allergen Reactions  . Metoclopramide Hcl     REACTION: tremors  . Prochlorperazine Edisylate     REACTION: tremors  . Varenicline Tartrate     REACTION: nausea

## 2012-06-05 NOTE — Patient Instructions (Addendum)
Today we updated your med list in our EPIC system...    Continue your current medications the same...    We refilled your meds per request...  Today we did your follow up CXR & FASTING blood work...    We will contact you w/ the results when avail...  Please try to further decrease you smoking...    Use the OTC MUCINEX 600mg - 2tabs twice daily & lots of fluids for the congestion...  Call for any questions...  Let's plan a follow up visit in 6 months.Marland KitchenMarland Kitchen

## 2012-06-05 NOTE — Progress Notes (Signed)
Subjective:    Patient ID: Diane Williams, female    DOB: 05-10-1951, 61 y.o.   MRN: [id number]  HPI 61 y/o WF here for a follow up visit... she has mult med issues as noted below... she has been unable to quit smoking despite all our prev conversations & recommendations...  ~  December 22, 2009:  28mo follow up & not feeling well, she says... wonders if its the heat... 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 she is still smoking ~1/2ppd> min cough/ phlegm, etc... last CXR11/10= NAD... she has IC followed by Urology in [employer]... persistant FM symptoms> on Amitrip, Vicodin, Tramadol... mod anxiety on Gertie Gowda but wants to ch back to [employer]... OK TDAP today.  ~  June 22, 2010:  6 mo ROV- notes painful rash on upper chest & back on left side c/w shingles x 2d... c/o severe pain- not relieved by Vicodin & Tramadol, lying in fetal position on exam table today... we discussed Rx w/ FAMVIR 500mg Tid, Depo80/ Dosepak, & Duragesic-50...  she just has CSpine surg (ant cerv disckectomy) by DrKritzer 06/02/10...  still smoking 1/2 ppd & requesting smoking cessation help- Rx for Chantix written... due for yearly CXR & blood work but wants to wait due to severe shingles pain... GI followed by DrKaplan & stable IBS symptoms...  IC still followed by Urology in [place]... FM & back pain> she wants Vicodin & Tramadol refilled, & asking to switch the Clorazepate to Ativan for nerves...  ~  August 20, 2010:  she was [facility]> ruled out for cardiac prob & seen by DrNasher- disch on Percocet, Tramadol, Tylenol, Gabapentin, Amitriptyline... she continues to follow up w/ DrKritzer regarding her CSpine surg... still smoking ~1/2 ppd & intol to Chantix she says... c/o URI congestion & persist  itching from the shingles> we discussed ZPak & Atarax...  ~  Dec 07, 2010:  69mo ROV & she states improved> she's been caring for her 51 y/o father & things have calmed down at home, felling better overall w/ Celexa added;  Requests refills Gabapentin (for post herpetic neuralgia), Librax (for IBS), Vicodin (for back pain & FM) & change Ativan back to Tranxene...  She will return for fasting blood work>>  ~  June 07, 2011:  28mo ROV & she tells me she is sched for cat surg soon; but is c/o feeling sick to her stomach for the past wk, notes some dysuria, low grade fever & HA;  I suggested that she hold off on the Cat surg for now & let us Rx w/ Cipro, and check CXR (biapical pleural thickening, coarse markings, CSpine surg, NAD);  Check B12 (still on low end at 238) & Urine (clear, culture neg);  She needs to quit the smoking & we discussed this again;  She is again asking to change anxiolytics- this time back to Ativan...  ~  Dec 06, 2011:  28mo ROV & Diane Williams is c/o recent poison oak exposure, treated OTC and improved, we discussed Depo/ Dosepak;  She is still smoking 1/2 ppd & shows no interest in smoking cessation help etc; there have been mult phone calls for refills of her Oxycodone & Lorazepam... We reviewed your prob list, meds, xrays and labs> see below>> she stopped her B12  oral supplement by mistake & she will restart this now; she feels the Amitrip really helps her rest; meds refilled today...  ~  June 05, 2012:  9mo ROV & Diane Williams has had a stable - still smoking ~1/2ppd & under a lot of stress w/ son in jail for 66yr due to spousal abuse, her father passed away w/ MI, she heats w/ wood stive  & now has to do all the work, triggered her FM, neck discomfort, etc...    Smoker, hx bronchitis> still smoking ~1/2ppd & declines smoking cessation help; notes mild cough, sm amt beige phlegm, no hemoptysis; DOE w/o change but she is too sedentary & we discussed exercise program...    GI>  GERD, Divertics, IBS> uses OTC Prilosec as needed; denies abd pain, n/v, c/d, blood seen; last colon 9/10 w/ adenomatous polyp removed...    Biliary tract disorder> extensive eval by DrKaplan in past w/ biliary sludge, had ERCP & sphincterotomy w/ stent- subseq removed & LFTs ret to norm; now LFTs are again elev & rec to return to [facility] for further eval...    GU> UTIs, IC> followed by Urology, DrCope in [place]...    Neck pain, LBP, FM> on Elavil25-3Qhs, Oxycod5Tid, Tramadol50Tid, Neurontin100-3Qhs; she had ant cx diskectomy by DrKritzer 11/11; followed in [facility] clinic & has seen DrDeveshwar for rheum...    Hx shingles> w/ post herpetic neuralgia on Neurontin...    Hx HAs> w/ prev eval by Neuro some yrs ago...    Anxiety/Depression> she likes to alternate requests for Chlorazepate & Lorazepam...    B12 Defic> Her B12 levels have been 214-238 & she is on Vit B12 daily; repeat B12= 740... We reviewed prob list, meds, xrays and labs> see below for updates >>  CXR 11/13 showed stable lung markings, NAD, +neck hardware... LABS 11/13:  FLP- ok w/ TChol=209 LDL=124;  Chems- ok x BS=114 SGOT=66 SGPT=58;  CBC- wnl;  TSH=1.55;  B12=714 NOTE> she is apparently [employer] & we cannot refer to GI- this must come from her primary & she is in process of establishing...          Problem List:  IRITIS (ICD-364.3) - see EMR note of 08/30/07 > Iritis followed by DrCohen of [facility]... we did a number of tests in 2009- collagen vasc screen was normal... pt reports she has early cataracts as well and is being followed...  CIGARETTE SMOKER (ICD-305.1) - still smoking 1/2 ppd w/ no interest in quitting...  we discussed smoking cessation strategies, she tried Chantix but states intol w/ nausea...  Hx of BRONCHITIS (ICD-490) - hx recurrent bronchitic infections w/ cough, congestion, sputum production etc... advised to quit smoking, use Mucinex, Fluids, etc... ~  CXR 11/10  was clear, NAD.[name]. ~  CXR 12/11 was clear x sl incr basilar markings...  ~  CXR 11/12 showed biapical pleural thickening, coarse markings, CSpine surg, NAD.[name]. ~  CXR 11/13 showed stable lung markings, NAD, +neck hardware...   GERD (ICD-530.81) - uses Prn Prilosec...  IRRITABLE BOWEL SYNDROME & DIVERTICULOSIS OF COLON - we discussed trying Activa/ Align/ etc... Prev on Bentyl but insurance won't cover> change to LIBRAX Prn.... ~  colonoscopy 5/99 by Dorris Singh showed few divertics... due for f/u colon. ~  she had CTAbd 3/08 w/ diverticulosis seen... ~  colonoscopy for rectal bleeding 9/10 showed +adenomatous polyp removed, divertics and hems (bleeding likely from hems) & Rx w/ Anusol-HC suppos...  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 she had ERCP w/ sphincterotomy and stent~ all benign... no signs of pancreatitis... mod post ERCP pain that was persistant untl the stent was removed... she has PHENERGAN for nausea Prn. ~  she had ERCP & AbdSonar by Dorris Singh 7/10 w/ hx papillary stenosis & stent for sludge (LFTs were norm)- stone fragments seen, dilated ducts, no obstructing lesions seen... ~  11/10: labs showed AlkPhos= 181, SGOT= 52, SGPT= 53... sent to GIi to review. ~  f/u LFT's 1/11 were WNL...  f/u labs 12/11 were WNL...  f/u labs 7/12 were wnl... ~  11/13:  LFTs are sl elev w/ SGOT=66 SGPT=58 & she is referred to [facility] for further eval...  Hx of URINARY TRACT INFECTION (ICD-599.0) & INTERSTITIAL CYSTITIS (ICD-595.1) - followed by Urologist DrCope in [place] (& GYN= DrBerliner)... ~  Cysto 7/10- for hematuria & bladder symptoms- mild chr cystitis... on Pyridium. ~  She has persist dysuria symptoms but no signif infection found, treated w/ Cipro & asked to f/u w/ her Urologist...  NECK PAIN >> Hx CSpine surg w/ ant Cx  diskectomy, PEEK interbody spacer & plating 11/11 by DrKritzer...  BACK PAIN, LUMBAR (ICD-724.2) - she takes VICODIN Prn, and TRAMADOL Prn w/ control of her back pain and generalized muscle pain...  FIBROMYALGIA (ICD-729.1) - full eval in 12/26/01 by DrDeveshwar... still taking AMITRIPYLINE 75mg HS, & CELEXA 20mg /d...  Hx SHINGLES>  She requests GABAPENTIN 100mg  Tid refill for post herpetic neuralgia...  HEADACHE (ICD-784.0) - prev seen by Neuro, DrSchmidt in the past...  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 she has alternated requests for CHLORAZEPATE 7.5mg  Tid & LORAZEPAM 1mg  Tid for her nerves...   Past Surgical History  Procedure Date  . Appendectomy   . Hysterectomy and right oopherectomy   . Cholecystectomy   . Common bile duct stent by ercp w/sphincterotomy   . Tonsillectomy   . Tubal ligation   . Cspine surgery 05/2010    by Dr. Gerlene Fee    Outpatient Encounter Prescriptions as of 06/05/2012  Medication Sig Dispense Refill  . amitriptyline (ELAVIL) 25 MG tablet Take 3 tablets by mouth at bedtime  90 tablet  0  . gabapentin (NEURONTIN) 100 MG capsule Take 1 capsule (100 mg total) by mouth 3 (three) times daily. Start with 1 by mouth at bedtime and increase slowly to 3 tablets at bedtime  90 capsule  0  . Multiple Vitamins-Minerals (WOMENS MULTIVITAMIN PLUS) TABS Take 1 tablet by mouth daily.        [name] oxyCODONE (ROXICODONE) 5 MG immediate release tablet Take 1 tablet (5 mg total) by mouth 3 (three) times daily as needed for pain.  90 tablet  0  . traMADol (ULTRAM) 50 MG tablet Take 1 tablet (50 mg total) by mouth every 8 (eight) hours as needed.  90 tablet  5    Allergies  Allergen Reactions  . Metoclopramide Hcl     REACTION: tremors  . Prochlorperazine Edisylate     REACTION: tremors  . Varenicline Tartrate     REACTION: nausea    Current Medications, Allergies, Past Medical History, Past Surgical History, Family History, and Social History were reviewed in  [employer] record.    Review of Systems         See HPI - all other systems neg except as noted... The patient complains of chest  pain and dyspnea on exertion.  The patient denies anorexia, fever, weight loss, weight gain, vision loss, decreased hearing, hoarseness, syncope, peripheral edema, prolonged cough, headaches, hemoptysis, abdominal pain, melena, hematochezia, severe indigestion/heartburn, hematuria, incontinence, muscle weakness, suspicious skin lesions, transient blindness, difficulty walking, depression, unusual weight change, abnormal bleeding, enlarged lymph nodes, and angioedema.    Objective:   Physical Exam     WD, WN, 61 y/o WF in NAD... she is chr ill appearing... GENERAL:  Alert & oriented; pleasant & cooperative... HEENT:  [place]/AT, EOM-full, PERRLA, EACs-clear, TMs-wnl, NOSE-clear, THROAT-clear & wnl. NECK:  Supple w/ fairROM; no JVD; normal carotid impulses w/o bruits; no thyromegaly or nodules palpated; no lymphadenopathy. CHEST:  Clear to P & A; without wheezes/ rales/ or rhonchi heard... Rash c/w shingles left chest wall ~ T3-4 distrib. HEART:  Regular Rhythm; without murmurs/ rubs/ or gallops detected... ABDOMEN:  Soft & nontender no guarding or rebound, BS +x4Q, neg cva tenderness, no masses palp... EXT:  without deformities,  no varicose veins/ venous insuffic/ or edema. NEURO:  CN's intact;  no focal neuro deficits... DERM:  No lesions noted; no rash etc...  RADIOLOGY DATA:  Reviewed in the EPIC EMR & discussed w/ the patient...  LABORATORY DATA:  Reviewed in the EPIC EMR & discussed w/ the patient...   Assessment & Plan:    SMOKER, Hx Bronchitis>  Still smoking, refuses smoking cessation help, no recent bronchitic exac...  IBS>  She has Librax vs Levsin for better insurance coverage, she will f/u w/ GI is symptoms don't abate...  Elev LFT's>  Hx biliary track dis as noted above; she needs f/u w/ DrKaplan but has [place] access &  is in the process of seeing a new primary doctor.  GU>  Followed by Urology in [place]> she has recurrent GU symptoms and will f/u w/ Urology...  Back Pain/ FM/ chr pain syndrome>  She has Vicodin lim to 3/d, and Tramadol/ Neurontin/ etc...  Anxiety/ Depression>  She requests change back to Ativan vs Chlorazepate...   Patient's Medications  New Prescriptions   No medications on file  Previous Medications   MULTIPLE VITAMINS-MINERALS (WOMENS MULTIVITAMIN PLUS) TABS    Take 1 tablet by mouth daily.     VITAMIN B-12 (CYANOCOBALAMIN) 1000 MCG TABLET    Take 1,000 mcg by mouth daily.  Modified Medications   Modified Medication Previous Medication   AMITRIPTYLINE (ELAVIL) 25 MG TABLET amitriptyline (ELAVIL) 25 MG tablet      Take 3 tablets by mouth at bedtime    Take 3 tablets by mouth at bedtime   GABAPENTIN (NEURONTIN) 100 MG CAPSULE gabapentin (NEURONTIN) 100 MG capsule      Take 1 capsule (100 mg total) by mouth at bedtime. Start with 1 by mouth at bedtime and increase slowly to 3 tablets at bedtime    Take 1 capsule (100 mg total) by mouth 3 (three) times daily. Start with 1 by mouth at bedtime and increase slowly to 3 tablets at bedtime   LORAZEPAM (ATIVAN) 0.5 MG TABLET LORazepam (ATIVAN) 0.5 MG tablet      Take 1 tablet (0.5 mg total) by mouth 3 (three) times daily as needed for anxiety.    Take 0.5 mg by mouth 3 (three) times daily as needed.   OXYCODONE (ROXICODONE) 5 MG IMMEDIATE RELEASE TABLET oxyCODONE (ROXICODONE) 5 MG immediate release tablet      Take 1 tablet (5 mg total) by mouth 3 (three) times daily as needed for pain.  Take 1 tablet (5 mg total) by mouth 3 (three) times daily as needed for pain.   TRAMADOL (ULTRAM) 50 MG TABLET traMADol (ULTRAM) 50 MG tablet      Take 1 tablet (50 mg total) by mouth every 8 (eight) hours as needed for pain.    Take 1 tablet (50 mg total) by mouth every 8 (eight) hours as needed.  Discontinued Medications   GABAPENTIN (NEURONTIN) 100 MG  CAPSULE    Take 100 mg by mouth at bedtime. Start with 1 by mouth at bedtime and increase slowly to 3 tablets at bedtime

## 2012-06-05 NOTE — Telephone Encounter (Signed)
Per SN---call the pharmacy and cancel any clorazepate----ok to call in lorazepam 0.5 mg   #90  1 po tid prn nerves with 5 refills.   i called the pharmacy and they stated that the pt has not filled the clorazepate for a while and has been getting the lorazepam.  The pharmacy will refill this medication for the pt and i called and spoke with the pt and she is aware that this has been called to the pharmacy.  Nothing further is needed.

## 2012-06-07 ENCOUNTER — Other Ambulatory Visit: Payer: Self-pay | Admitting: Pulmonary Disease

## 2012-06-07 DIAGNOSIS — R945 Abnormal results of liver function studies: Secondary | ICD-10-CM

## 2012-06-07 NOTE — Progress Notes (Signed)
Quick Note:  Spoke with patient,she is aware of results/recs as listed below per Dr. Kriste Basque. Verbalized understanding of this and nothing further needed at this time. ______

## 2012-06-09 ENCOUNTER — Telehealth: Payer: Self-pay | Admitting: Pulmonary Disease

## 2012-06-09 NOTE — Telephone Encounter (Signed)
I spoke with pt and she stated she spoke with her social worker and she will not be able to get in with new pcp until January. She wanted to know if this was okay.  I spoke with libby and per Almyra Free reason she could not see GI was bc she has [place] access and they require referral from pt from pcp (which is [facility]). She called them and they have not seen pt and is not accepting new pts.   I spoke with SN and made him aware of all this. Per SN 1) Yes pt is okay to wait until January to see her PCP for referral and for her to ask her pcp to recheck her liver enzymes 2) pt needs to avoid ALL alcohol including wince 3) if pt developes any abdominal pain, nausea, or vomiting she needs to go to ED to be evaluated  I called and then made pt aware of SN recs, She voiced her understanding and needed nothing further

## 2012-06-19 ENCOUNTER — Emergency Department (HOSPITAL_COMMUNITY): Payer: Medicare Other

## 2012-06-19 ENCOUNTER — Encounter (HOSPITAL_COMMUNITY): Payer: Self-pay | Admitting: Emergency Medicine

## 2012-06-19 ENCOUNTER — Observation Stay (HOSPITAL_COMMUNITY)
Admission: EM | Admit: 2012-06-19 | Discharge: 2012-06-21 | Disposition: A | Discharge status: Home / Self Care | Payer: Medicare Other | Attending: Cardiology | Admitting: Cardiology

## 2012-06-19 DIAGNOSIS — IMO0001 Reserved for inherently not codable concepts without codable children: Secondary | ICD-10-CM | POA: Insufficient documentation

## 2012-06-19 DIAGNOSIS — E785 Hyperlipidemia, unspecified: Secondary | ICD-10-CM | POA: Insufficient documentation

## 2012-06-19 DIAGNOSIS — F329 Major depressive disorder, single episode, unspecified: Secondary | ICD-10-CM | POA: Diagnosis present

## 2012-06-19 DIAGNOSIS — Z720 Tobacco use: Secondary | ICD-10-CM

## 2012-06-19 DIAGNOSIS — F341 Dysthymic disorder: Secondary | ICD-10-CM | POA: Insufficient documentation

## 2012-06-19 DIAGNOSIS — F419 Anxiety disorder, unspecified: Secondary | ICD-10-CM

## 2012-06-19 DIAGNOSIS — K219 Gastro-esophageal reflux disease without esophagitis: Secondary | ICD-10-CM | POA: Insufficient documentation

## 2012-06-19 DIAGNOSIS — G8929 Other chronic pain: Secondary | ICD-10-CM | POA: Insufficient documentation

## 2012-06-19 DIAGNOSIS — H209 Unspecified iridocyclitis: Secondary | ICD-10-CM | POA: Insufficient documentation

## 2012-06-19 DIAGNOSIS — F17200 Nicotine dependence, unspecified, uncomplicated: Secondary | ICD-10-CM | POA: Insufficient documentation

## 2012-06-19 DIAGNOSIS — I200 Unstable angina: Secondary | ICD-10-CM

## 2012-06-19 DIAGNOSIS — M542 Cervicalgia: Secondary | ICD-10-CM | POA: Insufficient documentation

## 2012-06-19 DIAGNOSIS — B0229 Other postherpetic nervous system involvement: Secondary | ICD-10-CM | POA: Insufficient documentation

## 2012-06-19 DIAGNOSIS — I2510 Atherosclerotic heart disease of native coronary artery without angina pectoris: Secondary | ICD-10-CM | POA: Insufficient documentation

## 2012-06-19 DIAGNOSIS — N3010 Interstitial cystitis (chronic) without hematuria: Secondary | ICD-10-CM | POA: Insufficient documentation

## 2012-06-19 DIAGNOSIS — R079 Chest pain, unspecified: Principal | ICD-10-CM | POA: Insufficient documentation

## 2012-06-19 DIAGNOSIS — K5730 Diverticulosis of large intestine without perforation or abscess without bleeding: Secondary | ICD-10-CM | POA: Insufficient documentation

## 2012-06-19 HISTORY — DX: Atherosclerotic heart disease of native coronary artery without angina pectoris: I25.10

## 2012-06-19 LAB — CBC WITH DIFFERENTIAL/PLATELET
Eosinophils Absolute: 0.1 10*3/uL (ref 0.0–0.7)
Hemoglobin: 13.9 g/dL (ref 12.0–15.0)
Lymphocytes Relative: 32 % (ref 12–46)
Lymphs Abs: 2.4 10*3/uL (ref 0.7–4.0)
MCH: 30.9 pg (ref 26.0–34.0)
Neutro Abs: 4.5 10*3/uL (ref 1.7–7.7)
Neutrophils Relative %: 59 % (ref 43–77)
Platelets: 270 10*3/uL (ref 150–400)
RBC: 4.5 MIL/uL (ref 3.87–5.11)
WBC: 7.5 10*3/uL (ref 4.0–10.5)

## 2012-06-19 LAB — URINALYSIS, ROUTINE W REFLEX MICROSCOPIC
Bilirubin Urine: NEGATIVE
Hgb urine dipstick: NEGATIVE
Specific Gravity, Urine: 1.008 (ref 1.005–1.030)
pH: 6 (ref 5.0–8.0)

## 2012-06-19 LAB — URINE MICROSCOPIC-ADD ON

## 2012-06-19 LAB — COMPREHENSIVE METABOLIC PANEL
ALT: 16 U/L (ref 0–35)
AST: 20 U/L (ref 0–37)
Alkaline Phosphatase: 110 U/L (ref 39–117)
CO2: 27 mEq/L (ref 19–32)
Calcium: 9.2 mg/dL (ref 8.4–10.5)
GFR calc non Af Amer: >90 mL/min (ref >90)
Potassium: 4 mEq/L (ref 3.5–5.1)
Sodium: 143 mEq/L (ref 135–145)

## 2012-06-19 LAB — HEPARIN LEVEL (UNFRACTIONATED): Heparin Unfractionated: 0.13 IU/mL — ABNORMAL LOW (ref 0.30–0.70)

## 2012-06-19 LAB — TROPONIN I
Troponin I: <0.3 ng/mL (ref <0.30)
Troponin I: <0.3 ng/mL (ref <0.30)

## 2012-06-19 LAB — MRSA PCR SCREENING: MRSA by PCR: POSITIVE — AB

## 2012-06-19 MED ORDER — SODIUM CHLORIDE 0.9 % IV SOLN
Freq: Once | INTRAVENOUS | Status: AC
  Administered 2012-06-19: 15:00:00 via INTRAVENOUS

## 2012-06-19 MED ORDER — SODIUM CHLORIDE 0.9 % IV SOLN: 250.0000 mL | INTRAVENOUS | Status: DC | PRN

## 2012-06-19 MED ORDER — HEPARIN BOLUS VIA INFUSION
4000.0000 [IU] | Freq: Once | INTRAVENOUS | Status: AC
  Administered 2012-06-19: 4000 [IU] via INTRAVENOUS

## 2012-06-19 MED ORDER — ASPIRIN EC 81 MG PO TBEC
81.0000 mg | DELAYED_RELEASE_TABLET | Freq: Every day | ORAL | Status: DC
  Administered 2012-06-20 – 2012-06-21 (×2): 81 mg via ORAL
  Filled 2012-06-19 (×2): qty 1

## 2012-06-19 MED ORDER — LORAZEPAM 0.5 MG PO TABS
0.2500 mg | ORAL_TABLET | Freq: Three times a day (TID) | ORAL | Status: DC | PRN
  Administered 2012-06-19 – 2012-06-21 (×3): 0.25 mg via ORAL
  Filled 2012-06-19 (×3): qty 1

## 2012-06-19 MED ORDER — HEPARIN (PORCINE) IN NACL 100-0.45 UNIT/ML-% IJ SOLN
1250.0000 [IU]/h | INTRAMUSCULAR | Status: DC
  Administered 2012-06-19: 1000 [IU]/h via INTRAVENOUS
  Administered 2012-06-20: 1250 [IU]/h via INTRAVENOUS
  Filled 2012-06-19 (×2): qty 250

## 2012-06-19 MED ORDER — GABAPENTIN 100 MG PO CAPS
100.0000 mg | ORAL_CAPSULE | Freq: Every day | ORAL | Status: DC
  Administered 2012-06-19 – 2012-06-21 (×3): 100 mg via ORAL
  Filled 2012-06-19 (×5): qty 1

## 2012-06-19 MED ORDER — OXYCODONE HCL 5 MG PO TABS
5.0000 mg | ORAL_TABLET | Freq: Three times a day (TID) | ORAL | Status: DC | PRN
  Administered 2012-06-19 – 2012-06-21 (×5): 5 mg via ORAL
  Filled 2012-06-19 (×6): qty 1

## 2012-06-19 MED ORDER — ONDANSETRON HCL 4 MG/2ML IJ SOLN
4.0000 mg | Freq: Once | INTRAMUSCULAR | Status: AC
  Administered 2012-06-19: 4 mg via INTRAVENOUS
  Filled 2012-06-19: qty 2

## 2012-06-19 MED ORDER — AMITRIPTYLINE HCL 75 MG PO TABS
75.0000 mg | ORAL_TABLET | Freq: Every day | ORAL | Status: DC
  Administered 2012-06-19 – 2012-06-20 (×2): 75 mg via ORAL
  Filled 2012-06-19 (×4): qty 1

## 2012-06-19 MED ORDER — DIAZEPAM 5 MG PO TABS
5.0000 mg | ORAL_TABLET | ORAL | Status: AC
  Administered 2012-06-20: 5 mg via ORAL
  Filled 2012-06-19: qty 1

## 2012-06-19 MED ORDER — SODIUM CHLORIDE 0.9 % IV SOLN
INTRAVENOUS | Status: DC
  Administered 2012-06-19 – 2012-06-20 (×2): via INTRAVENOUS

## 2012-06-19 MED ORDER — NITROGLYCERIN 0.4 MG SL SUBL
0.4000 mg | SUBLINGUAL_TABLET | SUBLINGUAL | Status: DC | PRN
  Administered 2012-06-19: 0.4 mg via SUBLINGUAL
  Filled 2012-06-19: qty 25

## 2012-06-19 MED ORDER — MORPHINE SULFATE 4 MG/ML IJ SOLN
4.0000 mg | Freq: Once | INTRAMUSCULAR | Status: AC
  Administered 2012-06-19: 4 mg via INTRAVENOUS
  Filled 2012-06-19: qty 1

## 2012-06-19 MED ORDER — METOPROLOL TARTRATE 12.5 MG HALF TABLET
12.5000 mg | ORAL_TABLET | Freq: Two times a day (BID) | ORAL | Status: DC
  Administered 2012-06-19: 12.5 mg via ORAL
  Filled 2012-06-19 (×4): qty 1

## 2012-06-19 MED ORDER — HEPARIN BOLUS VIA INFUSION
2000.0000 [IU] | Freq: Once | INTRAVENOUS | Status: AC
  Administered 2012-06-19: 2000 [IU] via INTRAVENOUS
  Filled 2012-06-19: qty 2000

## 2012-06-19 MED ORDER — ACETAMINOPHEN 325 MG PO TABS
650.0000 mg | ORAL_TABLET | ORAL | Status: DC | PRN
  Administered 2012-06-20: 650 mg via ORAL
  Filled 2012-06-19: qty 2

## 2012-06-19 MED ORDER — TRAMADOL HCL 50 MG PO TABS
50.0000 mg | ORAL_TABLET | Freq: Three times a day (TID) | ORAL | Status: DC | PRN
  Administered 2012-06-19 – 2012-06-21 (×4): 50 mg via ORAL
  Filled 2012-06-19 (×4): qty 1

## 2012-06-19 MED ORDER — ONDANSETRON HCL 4 MG/2ML IJ SOLN: 4.0000 mg | Freq: Four times a day (QID) | INTRAMUSCULAR | Status: DC | PRN

## 2012-06-19 NOTE — ED Provider Notes (Signed)
History     CSN: [id number]  Arrival date & time 06/19/12  1239   First MD Initiated Contact with Patient 06/19/12 1254      Chief Complaint  Patient presents with  . Neck Pain  . Chest Pain  . Arm Pain    (Consider location/radiation/quality/duration/timing/severity/associated sxs/prior treatment) Patient is a 61 y.o. female presenting with neck pain, chest pain, and arm pain. The history is provided by the patient.  Neck Pain  Associated symptoms include chest pain.  Chest Pain    Arm Pain Associated symptoms include chest pain.  She noted onset last night of pain in her neck and in her chest while she was decorating her Christmas tree. The neck pain was crampy and the chest pain was heavy. There is associated dyspnea. Whenever she stopped working in a tree and sat down, the pain would resolve in 10-15 minutes. She will go back to decorating the tree and the symptoms would recur. There is associated nausea, vomiting, and diaphoresis. Today, she is not having the neck pain but continues to have the chest heaviness and nausea and vomiting. She is worried because there is a family history of coronary artery disease, but not of premature coronary atherosclerosis. She is a cigarette smoker. She has not had pains like this before. EMS gave her aspirin and. She has not tried any other medication for this. Pain is severe and she rates it at 10/10.  Past Medical History  Diagnosis Date  . Iritis   . History of bronchitis   . GERD (gastroesophageal reflux disease)   . Diverticulosis of colon   . IBS (irritable bowel syndrome)   . Unspecified disorder of biliary tract   . UTI (lower urinary tract infection)   . Interstitial cystitis   . Lumbar back pain   . Fibromyalgia   . Headache   . Dysthymia   . History of shingles     Past Surgical History  Procedure Date  . Appendectomy   . Hysterectomy and right oopherectomy   . Cholecystectomy   . Common bile duct stent by ercp  w/sphincterotomy   . Tonsillectomy   . Tubal ligation   . Cspine surgery 05/2010    by Dr. Gerlene Fee    No family history on file.  History  Substance Use Topics  . Smoking status: Current Every Day Smoker -- 0.5 packs/day    Types: Cigarettes  . Smokeless tobacco: Never Used     Comment: 2 packs per week  . Alcohol Use: No     Comment: rare use    OB History    Grav Para Term Preterm Abortions TAB SAB Ect Mult Living                  Review of Systems  HENT: Positive for neck pain.   Cardiovascular: Positive for chest pain.  All other systems reviewed and are negative.    Allergies  Metoclopramide hcl; Prochlorperazine edisylate; and Varenicline tartrate  Home Medications   Current Outpatient Rx  Name  Route  Sig  Dispense  Refill  . AMITRIPTYLINE HCL 25 MG PO TABS      Take 3 tablets by mouth at bedtime   90 tablet   3   . GABAPENTIN 100 MG PO CAPS   Oral   Take 1 capsule (100 mg total) by mouth at bedtime. Start with 1 by mouth at bedtime and increase slowly to 3 tablets at bedtime   90 capsule  3   . LORAZEPAM 0.5 MG PO TABS   Oral   Take 0.25 mg by mouth every 8 (eight) hours as needed. For anxiety         . WOMENS MULTIVITAMIN PLUS PO TABS   Oral   Take 1 tablet by mouth daily.           . OXYCODONE HCL 5 MG PO TABS   Oral   Take 1 tablet (5 mg total) by mouth 3 (three) times daily as needed for pain.   90 tablet   0   . TRAMADOL HCL 50 MG PO TABS   Oral   Take 1 tablet (50 mg total) by mouth every 8 (eight) hours as needed for pain.   90 tablet   5   . VITAMIN B-12 1000 MCG PO TABS   Oral   Take 1,000 mcg by mouth daily.           BP 122/73  Pulse 80  Temp 98.8 F (37.1 C) (Oral)  Resp 16  Ht 5\' 5"  (1.651 m)  Wt 150 lb (68.04 kg)  BMI 24.96 kg/m2  SpO2 100%  Physical Exam  Nursing note and vitals reviewed. 61 year old female, resting comfortably and in no acute distress. Vital signs are normal. Oxygen saturation is  100%, which is normal. Head is normocephalic and atraumatic. PERRLA, EOMI. Oropharynx is clear. Neck is nontender and supple without adenopathy or JVD. Back is nontender and there is no CVA tenderness. Lungs are clear without rales, wheezes, or rhonchi. Chest is nontender. Heart has regular rate and rhythm without murmur. Abdomen is soft, flat, nontender without masses or hepatosplenomegaly and peristalsis is normoactive. Extremities have no cyanosis or edema, full range of motion is present. Skin is warm and dry without rash. Neurologic: Mental status is normal, cranial nerves are intact, there are no motor or sensory deficits.   ED Course  Procedures (including critical care time)  Results for orders placed during the hospital encounter of 06/19/12  CBC WITH DIFFERENTIAL      Component Value Range   WBC 7.5  4.0 - 10.5 K/uL   RBC 4.50  3.87 - 5.11 MIL/uL   Hemoglobin 13.9  12.0 - 15.0 g/dL   HCT 16.1  09.6 - 04.5 %   MCV 91.6  78.0 - 100.0 fL   MCH 30.9  26.0 - 34.0 pg   MCHC 33.7  30.0 - 36.0 g/dL   RDW 40.9  81.1 - 91.4 %   Platelets 270  150 - 400 K/uL   Neutrophils Relative 59  43 - 77 %   Neutro Abs 4.5  1.7 - 7.7 K/uL   Lymphocytes Relative 32  12 - 46 %   Lymphs Abs 2.4  0.7 - 4.0 K/uL   Monocytes Relative 7  3 - 12 %   Monocytes Absolute 0.5  0.1 - 1.0 K/uL   Eosinophils Relative 1  0 - 5 %   Eosinophils Absolute 0.1  0.0 - 0.7 K/uL   Basophils Relative 0  0 - 1 %   Basophils Absolute 0.0  0.0 - 0.1 K/uL  TROPONIN I      Component Value Range   Troponin I <0.30  <0.30 ng/mL  COMPREHENSIVE METABOLIC PANEL      Component Value Range   Sodium 143  135 - 145 mEq/L   Potassium 4.0  3.5 - 5.1 mEq/L   Chloride 105  96 - 112 mEq/L   CO2  27  19 - 32 mEq/L   Glucose, Bld 109 (*) 70 - 99 mg/dL   BUN 5 (*) 6 - 23 mg/dL   Creatinine, Ser 2.95  0.50 - 1.10 mg/dL   Calcium 9.2  8.4 - 62.1 mg/dL   Total Protein 6.6  6.0 - 8.3 g/dL   Albumin 3.4 (*) 3.5 - 5.2 g/dL   AST  20  0 - 37 U/L   ALT 16  0 - 35 U/L   Alkaline Phosphatase 110  39 - 117 U/L   Total Bilirubin 0.3  0.3 - 1.2 mg/dL   GFR calc non Af Amer >90  >90 mL/min   GFR calc Af Amer >90  >90 mL/min  URINALYSIS, ROUTINE W REFLEX MICROSCOPIC      Component Value Range   Color, Urine YELLOW  YELLOW   APPearance CLEAR  CLEAR   Specific Gravity, Urine 1.008  1.005 - 1.030   pH 6.0  5.0 - 8.0   Glucose, UA NEGATIVE  NEGATIVE mg/dL   Hgb urine dipstick NEGATIVE  NEGATIVE   Bilirubin Urine NEGATIVE  NEGATIVE   Ketones, ur NEGATIVE  NEGATIVE mg/dL   Protein, ur NEGATIVE  NEGATIVE mg/dL   Urobilinogen, UA 0.2  0.0 - 1.0 mg/dL   Nitrite NEGATIVE  NEGATIVE   Leukocytes, UA TRACE (*) NEGATIVE  URINE MICROSCOPIC-ADD ON      Component Value Range   Squamous Epithelial / LPF RARE  RARE   WBC, UA 0-2  <3 WBC/hpf   Bacteria, UA RARE  RARE   Urine-Other RARE YEAST     Dg Chest Portable 1 View  06/19/2012  *RADIOLOGY REPORT*  Clinical Data: Chest pain, left neck pain  PORTABLE CHEST - 1 VIEW  Comparison: 06/05/2012  Findings: Cardiomediastinal silhouette is stable.  No acute infiltrate or pleural effusion.  No pulmonary edema.  Mild degenerative changes thoracic spine.  IMPRESSION: No active disease.   Original Report Authenticated By: Natasha Mead, M.D.       Date: 06/19/2012  Rate: 82  Rhythm: normal sinus rhythm  QRS Axis: normal  Intervals: normal  ST/T Wave abnormalities: normal  Conduction Disutrbances:nonspecific intraventricular conduction delay  Narrative Interpretation: Nonspecific intraventricular conduction delay. When compared with ECG of 07/17/2010, no significant changes are seen.  Old EKG Reviewed: unchanged    1. Unstable angina       MDM  Exertional neck and chest pain worrisome for angina. No acute ECG changes are seen. Cardiac markers have been ordered and she'll be given a therapeutic trial of nitroglycerin. Her records have been reviewed and she has had no cardiology  evaluations  She got no relief with nitroglycerin. She started on heparin drip to treat unstable angina and given morphine for pain. Cardiology consultation has been requested. [facility] is to see the patient in the ED.      Dione Booze, MD 06/19/12 [phone]

## 2012-06-19 NOTE — Progress Notes (Signed)
ANTICOAGULATION CONSULT NOTE - Initial Consult  Pharmacy Consult for Heparin Indication:  ACS  Allergies  Allergen Reactions  . Metoclopramide Hcl     REACTION: tremors  . Prochlorperazine Edisylate     REACTION: tremors  . Varenicline Tartrate     REACTION: nausea   Patient Measurements: Height: 5' 5.5" (166.4 cm) Weight: 151 lb (68.493 kg) IBW/kg (Calculated) : 58.15  Heparin Dosing Weight: 68 kg  Vital Signs: Temp: 98.8 F (37.1 C) (12/02 1253) Temp src: Oral (12/02 1253) BP: 123/70 mmHg (12/02 1515) Pulse Rate: 71  (12/02 1515)  Labs:  Basename 06/19/12 1523  HGB 13.9  HCT 41.2  PLT 270  APTT --  LABPROT --  INR --  HEPARINUNFRC --  CREATININE --  CKTOTAL --  CKMB --  TROPONINI --   Estimated Creatinine Clearance: 67.8 ml/min (by C-G formula based on Cr of 0.6).  Medical History: Past Medical History  Diagnosis Date  . Iritis   . History of bronchitis   . GERD (gastroesophageal reflux disease)   . Diverticulosis of colon   . IBS (irritable bowel syndrome)   . Unspecified disorder of biliary tract   . UTI (lower urinary tract infection)   . Interstitial cystitis   . Lumbar back pain   . Fibromyalgia   . Headache   . Dysthymia   . History of shingles    Medications:  Amitriptyline 75 mg at bedtime Gabapentin 100-300 mg at bedtime (titrating dose) Lorazepam 0.25 mg every 8 hours as needed for anxiety MVI daily Oxycodone IR 5mg  three times daily as needed for pain Tramadol 50mg  one tablet every eight hours as needed for pain. Vitamin B-12 daily  Assessment: This is a 61 yo female, who presents with chest pain.  She has had one episode in the past but work-up was negative.  She has some history of GERD and takes PRN Prilosec.  I spoke with her regarding any use of anticoagulants and she denies.  She states she has had no history of bleeding complications.  I explained that her doctor wanted [place] to start some IV heparin and she voiced  understanding.  Her labs reveal normal H/H and platelet function.  She had a portable chest xray that revealed no active disease.   Goal of Therapy:  Heparin level 0.3-0.7 units/ml Monitor platelets by anticoagulation protocol: Yes   Plan:   Heparin 4000 units IV bolus x1  Heparin infusion at 1000 units/hr  Obtain a heparin level in 6 hours and adjust as needed to maintain goal range.  Daily heparin level/CBC  Nadara Mustard, PharmD., MS Clinical Pharmacist Pager:  [phone] Thank you for allowing pharmacy to be part of this patients care team. 06/19/2012,3:56 PM

## 2012-06-19 NOTE — H&P (Signed)
History and Physical   Patient ID: Diane Williams MRN: [id number], DOB/AGE: 1950/12/21   Admit date: 06/19/2012 Date of Consult: 06/19/2012   Primary Physician: Michele Mcalpine, MD Primary Cardiologist: New to cardiology- Dr. Riley Kill  HPI: [name] is a 61yo female with a complex PMHx including ongoing tobacco abuse, HLD, GERD, chronic neck pain (s/p diskectomy on multiple pain medications followed by pain management), postherpetic neuralgia, anxiety/depression (on chronic benzos), chronic venous insufficiency and persistently elevated LFTs (s/p sphincterotomy for biliary sludge with normalization, then subsuquent elevation) who presents to [facility] ED with c/o chest pain.  She reports seeing a cardiologist in the early 1980s for chest pain, and states she was diagnosed with MVP. She has not seen a cardiologist since.   She reports decorating her Christmas tree yesterday, and every time should would stand up, she would experience bilateral neck aching and shortness of breath. This occurred intermittently throughout the day, and she decided to rest. This morning, she experienced left-sided dull constant chest pain radiating to her neck and and left arm with associated shortness of breath. She endorses diaphoresis, nausea, PND, orthopnea, LEE, palpitations. No preceding exertional chest/neck pain. Some reminiscence to PHN pain. Some aggravation on palpation. Neck pain is different quality and location than chronic neck pain. Does report increased stress with the holidays. No extended travel recently. No unilateral leg pain or syncope. She does reports occasional lightheadedness. She reports father have CABG x 5 in his 25s. She reports ongoing tobacco abuse (1/2 PPD). She has smoked for 15 years.   In the ED, EKG reveals NSR, no ischemic changes. Initial troponin-I WNL.  CBC unremarkable. U/A unremarkable. Portable CXR without acute process. CMET pending. She was started on heparin. NTG provided no  relief. Some alleviation with morphine.  Problem List: Past Medical History  Diagnosis Date  . Iritis   . History of bronchitis   . GERD (gastroesophageal reflux disease)   . Diverticulosis of colon   . IBS (irritable bowel syndrome)   . Unspecified disorder of biliary tract   . UTI (lower urinary tract infection)   . Interstitial cystitis   . Lumbar back pain   . Fibromyalgia   . Headache   . Dysthymia   . History of shingles     Past Surgical History  Procedure Date  . Appendectomy   . Hysterectomy and right oopherectomy   . Cholecystectomy   . Common bile duct stent by ercp w/sphincterotomy   . Tonsillectomy   . Tubal ligation   . Cspine surgery 05/2010    by Dr. Gerlene Fee     Allergies:  Allergies  Allergen Reactions  . Metoclopramide Hcl     REACTION: tremors  . Prochlorperazine Edisylate     REACTION: tremors  . Varenicline Tartrate     REACTION: nausea    Home Medications: Prior to Admission medications   Medication Sig Start Date End Date Taking? Authorizing Provider  amitriptyline (ELAVIL) 25 MG tablet Take 3 tablets by mouth at bedtime 06/05/12 06/05/13 Yes Michele Mcalpine, MD  gabapentin (NEURONTIN) 100 MG capsule Take 1 capsule (100 mg total) by mouth at bedtime. Start with 1 by mouth at bedtime and increase slowly to 3 tablets at bedtime 06/05/12  Yes Michele Mcalpine, MD  LORazepam (ATIVAN) 0.5 MG tablet Take 0.25 mg by mouth every 8 (eight) hours as needed. For anxiety   Yes Historical Provider, MD  Multiple Vitamins-Minerals (WOMENS MULTIVITAMIN PLUS) TABS Take 1 tablet by mouth daily.  Yes Historical Provider, MD  oxyCODONE (ROXICODONE) 5 MG immediate release tablet Take 1 tablet (5 mg total) by mouth 3 (three) times daily as needed for pain. 06/05/12 06/05/13 Yes Michele Mcalpine, MD  traMADol (ULTRAM) 50 MG tablet Take 1 tablet (50 mg total) by mouth every 8 (eight) hours as needed for pain. 06/05/12 06/05/13 Yes Michele Mcalpine, MD  vitamin B-12  (CYANOCOBALAMIN) 1000 MCG tablet Take 1,000 mcg by mouth daily.   Yes Historical Provider, MD    Inpatient Medications:     . [COMPLETED]  morphine injection  4 mg Intravenous Once  . [COMPLETED] ondansetron (ZOFRAN) IV  4 mg Intravenous Once    (Not in a hospital admission)  No family history on file.   History   Social History  . Marital Status: Divorced    Spouse Name: N/A    Number of Children: 1  . Years of Education: N/A   Occupational History  . Not on file.   Social History Main Topics  . Smoking status: Current Every Day Smoker -- 0.5 packs/day    Types: Cigarettes  . Smokeless tobacco: Never Used     Comment: 2 packs per week  . Alcohol Use: No     Comment: rare use  . Drug Use: No  . Sexually Active: Not on file   Other Topics Concern  . Not on file   Social History Narrative  . No narrative on file     Review of Systems: General: positive negative for chills, subective fever, night sweats and weight changes.  Cardiovascular: positive for chest pain, dyspnea on exertion, edema, orthopnea, palpitations, paroxysmal nocturnal dyspnea or shortness of breath Dermatological: negative for rash Respiratory: positive for cough and occasional wheezing Urologic: negative for hematuria Abdominal: positive for nausea, vomiting, occasional diarrhea, negative bright red blood per rectum, melena, or hematemesis Neurologic:  negative for occasional visual changes and dizziness, negative for syncope All other systems reviewed and are otherwise negative except as noted above.  Physical Exam: Blood pressure 123/70, pulse 71, temperature 98.8 F (37.1 C), temperature source Oral, resp. rate 16, height 5' 5.5" (1.664 m), weight 68.493 kg (151 lb), SpO2 100.00%.    General: Appears older than stated age, thin, well developed, in no acute distress. Head: Normocephalic, atraumatic, sclera non-icteric, no xanthomas, nares are without discharge.  Neck: Negative for carotid  bruits. JVD not elevated. Lungs: Clear bilaterally to auscultation without wheezes, rales, or rhonchi. Breathing is unlabored. Heart: RRR with S1 S2. No murmurs, rubs, or gallops appreciated. Abdomen: Soft, non-tender, non-distended with normoactive bowel sounds. No hepatomegaly. No rebound/guarding. No obvious abdominal masses. Msk:  Strength and tone appears normal for age. Mild tenderness to palpation to left-sided sternum.  Extremities: Trace bilateral pretibial edema bilaterally. No clubbing or cyanosis.  Distal pedal pulses are 2+ and equal bilaterally. Neuro: Alert and oriented X 3. Moves all extremities spontaneously. Psych:  Responds to questions appropriately with a normal affect.  Labs: Recent Labs  [facility] 06/19/12 1523   WBC 7.5   HGB 13.9   HCT 41.2   MCV 91.6   PLT 270    Lab 06/19/12 1524  NA 143  K 4.0  CL 105  CO2 27  BUN 5*  CREATININE 0.51  CALCIUM 9.2  PROT 6.6  BILITOT 0.3  ALKPHOS 110  ALT 16  AST 20  AMYLASE --  LIPASE --  GLUCOSE 109*   Recent Labs  [facility] 06/19/12 1530   CKTOTAL --  CKMB --   CKMBINDEX --   TROPONINI <0.30   Radiology/Studies: Dg Chest 2 View  06/05/2012  *RADIOLOGY REPORT*  Clinical Data: 61 year old female smoker.  Bronchitis.  CHEST - 2 VIEW  Comparison: 06/07/2011 and earlier.  Findings: Stable lung volumes.  Stable cardiac size and mediastinal contours.  Stable lung markings.  No pneumothorax, pulmonary edema, pleural effusion or acute pulmonary opacity. No acute osseous abnormality identified.    Cervical ACDF hardware re-identified.  IMPRESSION: No acute cardiopulmonary abnormality.   Original Report Authenticated By: Erskine Speed, M.D.    Dg Chest Portable 1 View  06/19/2012  *RADIOLOGY REPORT*  Clinical Data: Chest pain, left neck pain  PORTABLE CHEST - 1 VIEW  Comparison: 06/05/2012  Findings: Cardiomediastinal silhouette is stable.  No acute infiltrate or pleural effusion.  No pulmonary edema.  Mild degenerative  changes thoracic spine.  IMPRESSION: No active disease.   Original Report Authenticated By: Natasha Mead, M.D.    EKG: NSR, 82 bpm, poor R wave progression, no ST/T changes  ASSESSMENT AND PLAN:   1. Chest pain with moderate risk of cardiac etiology- patient's HPI contains both typical and atypical features of cardiac chest pain. There was no preceding exertional chest pain. She notes intermittent neck discomfort on exertion yesterday relieved with rest, and sharp, then dull left-sided chest pain awaking her from sleep the following morning with radiation to her arm and bilateral neck. Some aggravation on palpation. Distribution similar to that of post-herpetic neuralgia. No relief with NTG. She does have a family history of CAD (brother 106s, father 14s), ongoing tobacco abuse and hyperlipidemia. No prior ischemic evaluation. She is at least moderate pre-test probability for coronary ischemia. Plan overnight observation, formal rule-out and ischemic evaluation in the morning- Lexiscan Myoview vs cardiac CT vs cardiac cath. Would favor cardiac cath for definitive eval. DDx includes #4-8 which could very well account for the patient's complaints. Risk stratify further with Hgb A1C (6.3% in 2011). Low-risk for PE- no tachypnea, tachycardia, hypoxia. Continue heparin. No appreciable clicks. Continue ASA, start BB. Hold on statin for now. Continue home meds for chronic pain.   2. HLD- LDL 123 last month. Hold off on statin for now.   3. Ongoing tobacco abuse- encouraged cessation. This has been followed and stressed by her pulmonologist for a while. She has reduced the amount of cigarettes she smokes, but continues to smoke 1/2 PPD. Offer nicotine replacement as a means of assistance.   4. Chronic neck pain- C5-C6 herniation s/p diskectomy. On numerous pain medications for this. Managed by pain clinic. Continue outpatient oxycodone.   5. GERD- PPI.   6. Post herpetic neuralgia- continue neurontin,  amitriptyline.  7. Fibromyalgia- continue neurontin, amitriptyline.   8. Anxiety/depression- continue ativan, amitriptyline.    Signed, R. Hurman Horn, PA-C 06/19/2012, 5:14 PM   Patient seen and examined.  She presents with worrisome symptoms of chest pain with minimal exertion, but there are both typical and atypical features of the presentation.  It is not NTG responsive, and responds to narcotics.  No def ECG change.  Enzyme initially neg.   Has marked pos family history.  Exam is remarkable for some lateral tenderness  (has fibromyalgia) but she says pain is central and up to neck, and different than before.  She cannot do much without symptoms.  Exam otherwise normal.  No def ECG changes.  Plan admission for rule out, and would lean toward cath as she has very limited prior to symptoms.  I  have explained the risks and benefits to the patient, and she is agreeable to proceeding with diagnostic cath.  She does have risk factors, but am not sure what we will find.[name]

## 2012-06-19 NOTE — ED Notes (Signed)
Patient claims that she was putting up decorations yesterday and started having "neck cramps that felt like someone was choking me".   Patient claims that she started having chest pain with radiation down L arm today.   Per EMS, the chest pain and L arm pain resolved PTA, but patient disputes that saying that "it always hurts".

## 2012-06-19 NOTE — Progress Notes (Signed)
ANTICOAGULATION CONSULT NOTE   Pharmacy Consult for Heparin Indication:  ACS  Allergies  Allergen Reactions  . Metoclopramide Hcl     REACTION: tremors  . Prochlorperazine Edisylate     REACTION: tremors  . Varenicline Tartrate     REACTION: nausea   Patient Measurements: Height: 5' 5.5" (166.4 cm) Weight: 143 lb 8.3 oz (65.1 kg) IBW/kg (Calculated) : 58.15  Heparin Dosing Weight: 68 kg  Vital Signs: Temp: 98.8 F (37.1 C) (12/02 1253) Temp src: Oral (12/02 1253) BP: 107/65 mmHg (12/02 2030) Pulse Rate: 73  (12/02 2030)  Labs:  Basename 06/19/12 2236 06/19/12 1953 06/19/12 1530 06/19/12 1524 06/19/12 1523  HGB -- -- -- -- 13.9  HCT -- -- -- -- 41.2  PLT -- -- -- -- 270  APTT -- -- -- -- --  LABPROT -- -- -- -- --  INR -- -- -- -- --  HEPARINUNFRC 0.13* -- -- -- --  CREATININE -- -- -- 0.51 --  CKTOTAL -- -- -- -- --  CKMB -- -- -- -- --  TROPONINI -- <0.30 <0.30 -- --   Estimated Creatinine Clearance: 67.8 ml/min (by C-G formula based on Cr of 0.51).  Assessment: 61 yo female with chest pain for Heparin  Goal of Therapy:  Heparin level 0.3-0.7 units/ml Monitor platelets by anticoagulation protocol: Yes   Plan:  Heparin 2000 units IV bolus, then increase heparin 1250 units/hr Check heparin level in 6 hours.  Geannie Risen, PharmD, BCPS  06/19/2012,11:22 PM

## 2012-06-19 NOTE — ED Notes (Signed)
Spoke with Alinda Money PA with [facility] about pt c/o pain.  No order given at this time.  Pt notified and gives verbal understanding.

## 2012-06-20 ENCOUNTER — Encounter (HOSPITAL_COMMUNITY): Payer: Self-pay | Admitting: Cardiology

## 2012-06-20 ENCOUNTER — Encounter (HOSPITAL_COMMUNITY)
Admission: EM | Disposition: A | Discharge status: Home / Self Care | Payer: Self-pay | Source: Home / Self Care | Attending: Emergency Medicine

## 2012-06-20 DIAGNOSIS — I2510 Atherosclerotic heart disease of native coronary artery without angina pectoris: Secondary | ICD-10-CM

## 2012-06-20 HISTORY — PX: LEFT HEART CATHETERIZATION WITH CORONARY ANGIOGRAM: SHX5451

## 2012-06-20 LAB — HEMOGLOBIN A1C: Mean Plasma Glucose: 117 mg/dL — ABNORMAL HIGH (ref <117)

## 2012-06-20 LAB — BASIC METABOLIC PANEL
Calcium: 8.5 mg/dL (ref 8.4–10.5)
Creatinine, Ser: 0.5 mg/dL (ref 0.50–1.10)
GFR calc non Af Amer: >90 mL/min (ref >90)
Glucose, Bld: 92 mg/dL (ref 70–99)
Sodium: 142 mEq/L (ref 135–145)

## 2012-06-20 LAB — CBC
MCH: 30.3 pg (ref 26.0–34.0)
MCHC: 32.5 g/dL (ref 30.0–36.0)
Platelets: 249 10*3/uL (ref 150–400)
RBC: 4.03 MIL/uL (ref 3.87–5.11)
RDW: 13.4 % (ref 11.5–15.5)

## 2012-06-20 LAB — PROTIME-INR: Prothrombin Time: 14.2 seconds (ref 11.6–15.2)

## 2012-06-20 LAB — TROPONIN I: Troponin I: <0.3 ng/mL (ref <0.30)

## 2012-06-20 SURGERY — LEFT HEART CATHETERIZATION WITH CORONARY ANGIOGRAM
Anesthesia: LOCAL

## 2012-06-20 MED ORDER — SODIUM CHLORIDE 0.9 % IJ SOLN: 3.0000 mL | Freq: Two times a day (BID) | INTRAMUSCULAR | Status: DC

## 2012-06-20 MED ORDER — CHLORHEXIDINE GLUCONATE CLOTH 2 % EX PADS
6.0000 | MEDICATED_PAD | Freq: Every day | CUTANEOUS | Status: DC
  Administered 2012-06-21: 06:00:00 6 via TOPICAL

## 2012-06-20 MED ORDER — POTASSIUM CHLORIDE CRYS ER 20 MEQ PO TBCR
EXTENDED_RELEASE_TABLET | ORAL | Status: AC
  Filled 2012-06-20: qty 2

## 2012-06-20 MED ORDER — SODIUM CHLORIDE 0.9 % IJ SOLN: 3.0000 mL | INTRAMUSCULAR | Status: DC | PRN

## 2012-06-20 MED ORDER — HEPARIN (PORCINE) IN NACL 2-0.9 UNIT/ML-% IJ SOLN
INTRAMUSCULAR | Status: AC
  Filled 2012-06-20: qty 1000

## 2012-06-20 MED ORDER — LIDOCAINE HCL (PF) 1 % IJ SOLN
INTRAMUSCULAR | Status: AC
  Filled 2012-06-20: qty 30

## 2012-06-20 MED ORDER — NITROGLYCERIN 0.2 MG/ML ON CALL CATH LAB
INTRAVENOUS | Status: AC
  Filled 2012-06-20: qty 1

## 2012-06-20 MED ORDER — MUPIROCIN 2 % EX OINT
1.0000 "application " | TOPICAL_OINTMENT | Freq: Two times a day (BID) | CUTANEOUS | Status: DC
  Administered 2012-06-20 – 2012-06-21 (×3): 1 via NASAL
  Filled 2012-06-20: qty 22

## 2012-06-20 MED ORDER — ACETAMINOPHEN 325 MG PO TABS: 650.0000 mg | ORAL_TABLET | ORAL | Status: DC | PRN

## 2012-06-20 MED ORDER — SODIUM CHLORIDE 0.9 % IV SOLN
INTRAVENOUS | Status: AC
  Administered 2012-06-20: 11:00:00 via INTRAVENOUS

## 2012-06-20 MED ORDER — MIDAZOLAM HCL 2 MG/2ML IJ SOLN
INTRAMUSCULAR | Status: AC
  Filled 2012-06-20: qty 2

## 2012-06-20 MED ORDER — POTASSIUM CHLORIDE CRYS ER 20 MEQ PO TBCR
40.0000 meq | EXTENDED_RELEASE_TABLET | Freq: Once | ORAL | Status: AC
  Administered 2012-06-20: 40 meq via ORAL

## 2012-06-20 MED ORDER — FAMOTIDINE 40 MG PO TABS
40.0000 mg | ORAL_TABLET | Freq: Once | ORAL | Status: AC
  Administered 2012-06-20: 40 mg via ORAL
  Filled 2012-06-20: qty 1

## 2012-06-20 NOTE — CV Procedure (Addendum)
   Cardiac Catheterization Procedure Note  Name: Diane Williams MRN: [id number] DOB: 06-22-1951  Procedure: Left Heart Cath, Selective Coronary Angiography, LV angiography  Indication: patient has multiple CRF and smokes, and presents with persistent chest pain and neck pain.  Enzymes normal.  Diagnostic cath recommend for definitive diagnosis.     Procedural details: The right groin was prepped, draped, and anesthetized with 1% lidocaine. Using modified Seldinger technique, a 4 French sheath was introduced into the right femoral artery. Standard Judkins catheters were used for coronary angiography and left ventriculography. Catheter exchanges were performed over a guidewire. There were no immediate procedural complications. The patient was transferred to the post catheterization recovery area for further monitoring.  Procedural Findings: Hemodynamics:  AO 106/59 (80) LV 114/9 No gradient on pullback   Coronary angiography: Coronary dominance: right  Left mainstem: Normal with no obstruction  Left anterior descending (LAD): Vessel is moderately calcified, with circumferential calcification noted in the proximal LAD.  There is calcification and perhaps 20-30% segmental plaque that crosses between the takeoffs of the two diagonals and overlying the septal.  This does not appear to be flow limiting.  Diagonal 2 and 3 have mild ostial irregularity.  The distal LAD tapers, wraps the apex, and is without significant focal narrowing.    Left circumflex (LCx): Provides a large OM and smaller AV circumflex.  The proximal portion of the OM has about 40-50% eccentric plaquing in the proximal vessel.  The distal OM is fairly large.    Right coronary artery (RCA): The RCA is  large in caliber and provides a smaller PDA and large PLA.  There is no critical obstruction.  There is 10% narrowing in the proximal portion of the mid vessel.    Left ventriculography: Left ventricular systolic function is  normal, LVEF is estimated at 55-65%, there is no significant mitral regurgitation   Final Conclusions:   1.  Calcified plaque in the LAD without focal narrowing 2.  40-50% narrowing in the proximal OM 3.  Minor plaque in the RCA as noted 4.  Preserved LV function.  Recommendations:  1.  She has some chest pain and mostly neck pain.  She is concerned with her prior surgical site.  We will have NS evaluate her prior to discharge.  As her insurance has been changed, pain management will be difficult as her primary is not currently taking her insurance.  Have also given her a strong suggestion to stop smoking and discussed the benefits and mechanics.    [name] 06/20/2012, 10:54 AM

## 2012-06-20 NOTE — Interval H&P Note (Signed)
History and Physical Interval Note:  06/20/2012 10:05 AM  Diane Williams  has presented today for surgery, with the diagnosis of cp  The various methods of treatment have been discussed with the patient.  She has CP and neck pain at rest, and is insistent that it is not GERD. After consideration of risks, benefits and other options for treatment, the patient has consented to  Procedure(s) (LRB) with comments: LEFT HEART CATHETERIZATION WITH CORONARY ANGIOGRAM (N/A) as a surgical intervention .  The patient's history has been reviewed, patient examined, no change in status, stable for surgery.  I have reviewed the patient's chart and labs.  Questions were answered to the patient's satisfaction.     [name]

## 2012-06-20 NOTE — Progress Notes (Signed)
Patient Name: Diane Williams Date of Encounter: 06/20/2012     Principal Problem:  *Chest pain with moderate risk for cardiac etiology Active Problems:  GERD  FIBROMYALGIA  POSTHERPETIC NEURALGIA  Depression  Hyperlipidemia  Tobacco abuse  Chronic neck pain  Anxiety    SUBJECTIVE  Patient still has discomfort in both the chest and neck which seems to be steady. Cardiac enzymes are negative times three.  BP is soft after beta blocker.    CURRENT MEDS    . [COMPLETED] sodium chloride   Intravenous Once  . amitriptyline  75 mg Oral QHS  . aspirin EC  81 mg Oral Daily  . diazepam  5 mg Oral On Call  . gabapentin  100 mg Oral QHS  . [COMPLETED] heparin  2,000 Units Intravenous Once  . [COMPLETED] heparin  4,000 Units Intravenous Once  . metoprolol tartrate  12.5 mg Oral BID  . [COMPLETED]  morphine injection  4 mg Intravenous Once  . [COMPLETED] ondansetron (ZOFRAN) IV  4 mg Intravenous Once  . potassium chloride  40 mEq Oral Once    OBJECTIVE  Filed Vitals:   06/20/12 0120 06/20/12 0400 06/20/12 0412 06/20/12 0600  BP: 89/54 84/50  100/51  Pulse: 71 69  63  Temp:   97.9 F (36.6 C)   TempSrc:   Oral   Resp: 17 16  17   Height:      Weight:      SpO2:  96%      Intake/Output Summary (Last 24 hours) at 06/20/12 0738 Last data filed at 06/20/12 0600  Gross per 24 hour  Intake 720.96 ml  Output    350 ml  Net 370.96 ml   Filed Weights   06/19/12 1248 06/19/12 1515 06/19/12 2030  Weight: 150 lb (68.04 kg) 151 lb (68.493 kg) 143 lb 8.3 oz (65.1 kg)    PHYSICAL EXAM  Thin older female in no distress. VSS as recorded. Lungs clear.  Cor reg. Abd  Soft.      Accessory Clinical Findings  CBC  Basename 06/20/12 0312 06/19/12 1523  WBC 8.0 7.5  NEUTROABS -- 4.5  HGB 12.2 13.9  HCT 37.5 41.2  MCV 93.1 91.6  PLT 249 270   Basic Metabolic Panel  Basename 06/20/12 0312 06/19/12 1524  NA 142 143  K 3.1* 4.0  CL 106 105  CO2 29 27  GLUCOSE 92 109*    BUN 7 5*  CREATININE 0.50 0.51  CALCIUM 8.5 9.2  MG -- --  PHOS -- --   Liver Function Tests  Basename 06/19/12 1524  AST 20  ALT 16  ALKPHOS 110  BILITOT 0.3  PROT 6.6  ALBUMIN 3.4*   No results found for this basename: LIPASE:2,AMYLASE:2 in the last 72 hours Cardiac Enzymes  Basename 06/20/12 0312 06/19/12 1953 06/19/12 1530  CKTOTAL -- -- --  CKMB -- -- --  CKMBINDEX -- -- --  TROPONINI <0.30 <0.30 <0.30   BNP No components found with this basename: POCBNP:3 D-Dimer No results found for this basename: DDIMER:2 in the last 72 hours Hemoglobin A1C  Basename 06/19/12 1953  HGBA1C 5.7*   Fasting Lipid Panel No results found for this basename: CHOL,HDL,LDLCALC,TRIG,CHOLHDL,LDLDIRECT in the last 72 hours Thyroid Function Tests No results found for this basename: TSH,T4TOTAL,FREET3,T3FREE,THYROIDAB in the last 72 hours    ECG  pending  Radiology/Studies  Dg Chest 2 View  06/05/2012  *RADIOLOGY REPORT*  Clinical Data: 61 year old female smoker.  Bronchitis.  CHEST -  2 VIEW  Comparison: 06/07/2011 and earlier.  Findings: Stable lung volumes.  Stable cardiac size and mediastinal contours.  Stable lung markings.  No pneumothorax, pulmonary edema, pleural effusion or acute pulmonary opacity. No acute osseous abnormality identified.    Cervical ACDF hardware re-identified.  IMPRESSION: No acute cardiopulmonary abnormality.   Original Report Authenticated By: Erskine Speed, M.D.    Dg Chest Portable 1 View  06/19/2012  *RADIOLOGY REPORT*  Clinical Data: Chest pain, left neck pain  PORTABLE CHEST - 1 VIEW  Comparison: 06/05/2012  Findings: Cardiomediastinal silhouette is stable.  No acute infiltrate or pleural effusion.  No pulmonary edema.  Mild degenerative changes thoracic spine.  IMPRESSION: No active disease.   Original Report Authenticated By: Natasha Mead, M.D.     ASSESSMENT AND PLAN   1.  Chest pain  - etiology unclear but still has at rest.  K is low and will  replace.  Hold beta blockers.  Increase IVF.  Plan cath study later in morning after K supplement.  Patient wants to proceed.   Signed, [name] MD, [facility], FSCAI

## 2012-06-20 NOTE — Progress Notes (Signed)
Spoke with Dr. Antoine Poche about pts SBP that dropped to the 80s after she was asleep. Pt is asymptomatic and has been sleeping without any distress or complaints. Pt even got up to use the [facility] with supervision and she denied dizziness or any signs or symptoms of distress related to her bp. No orders given, will continue to monitor pt.

## 2012-06-20 NOTE — Progress Notes (Signed)
ANTICOAGULATION CONSULT NOTE - Follow Up Consult  Pharmacy Consult for heparin Indication: chest pain/ACS  Allergies  Allergen Reactions  . Metoclopramide Hcl     REACTION: tremors  . Prochlorperazine Edisylate     REACTION: tremors  . Varenicline Tartrate     REACTION: nausea    Patient Measurements: Height: 5' 5.5" (166.4 cm) Weight: 143 lb 8.3 oz (65.1 kg) IBW/kg (Calculated) : 58.15  Heparin Dosing Weight: 65kg  Vital Signs: Temp: 97.7 F (36.5 C) (12/03 0800) Temp src: Oral (12/03 0800) BP: 92/58 mmHg (12/03 0800) Pulse Rate: 68  (12/03 0800)  Labs:  Diane Williams 06/20/12 0814 06/20/12 0312 06/19/12 2236 06/19/12 1953 06/19/12 1530 06/19/12 1524 06/19/12 1523  HGB -- 12.2 -- -- -- -- 13.9  HCT -- 37.5 -- -- -- -- 41.2  PLT -- 249 -- -- -- -- 270  APTT -- -- -- -- -- -- --  LABPROT 14.2 -- -- -- -- -- --  INR 1.11 -- -- -- -- -- --  HEPARINUNFRC 0.52 -- 0.13* -- -- -- --  CREATININE -- 0.50 -- -- -- 0.51 --  CKTOTAL -- -- -- -- -- -- --  CKMB -- -- -- -- -- -- --  TROPONINI -- <0.30 -- <0.30 <0.30 -- --    Estimated Creatinine Clearance: 67.8 ml/min (by C-G formula based on Cr of 0.5).  Assessment: 45 YOF with extensive PMH who presented to ED with chest pain. She was started on IV heparin, first level was subtherapeutic at 0.13 and she was rebolused and rate was increased to 1250 units/hr. Follow up level was therapeutic at 0.52. Hemoglobin and platelets are both stable and no bleeding is noted. Of note, patient is going to cath this morning.  Goal of Therapy:  Heparin level 0.3-0.7 units/ml Monitor platelets by anticoagulation protocol: Yes   Plan:  1. Continue heparin drip at rate of 1250units/hr 2. Daily CBC and heparin level 3. Follow up results of cath  Diane Williams, PharmD Clinical Pharmacist Pager: [phone] Phone: [phone] 06/20/2012 10:07 AM

## 2012-06-20 NOTE — H&P (View-Only) (Signed)
 Patient Name: Diane Williams Date of Encounter: 06/20/2012     Principal Problem:  *Chest pain with moderate risk for cardiac etiology Active Problems:  GERD  FIBROMYALGIA  POSTHERPETIC NEURALGIA  Depression  Hyperlipidemia  Tobacco abuse  Chronic neck pain  Anxiety    SUBJECTIVE  Patient still has discomfort in both the chest and neck which seems to be steady. Cardiac enzymes are negative times three.  BP is soft after beta blocker.    CURRENT MEDS    . [COMPLETED] sodium chloride   Intravenous Once  . amitriptyline  75 mg Oral QHS  . aspirin EC  81 mg Oral Daily  . diazepam  5 mg Oral On Call  . gabapentin  100 mg Oral QHS  . [COMPLETED] heparin  2,000 Units Intravenous Once  . [COMPLETED] heparin  4,000 Units Intravenous Once  . metoprolol tartrate  12.5 mg Oral BID  . [COMPLETED]  morphine injection  4 mg Intravenous Once  . [COMPLETED] ondansetron (ZOFRAN) IV  4 mg Intravenous Once  . potassium chloride  40 mEq Oral Once    OBJECTIVE  Filed Vitals:   06/20/12 0120 06/20/12 0400 06/20/12 0412 06/20/12 0600  BP: 89/54 84/50  100/51  Pulse: 71 69  63  Temp:   97.9 F (36.6 C)   TempSrc:   Oral   Resp: 17 16  17  Height:      Weight:      SpO2:  96%      Intake/Output Summary (Last 24 hours) at 06/20/12 0738 Last data filed at 06/20/12 0600  Gross per 24 hour  Intake 720.96 ml  Output    350 ml  Net 370.96 ml   Filed Weights   06/19/12 1248 06/19/12 1515 06/19/12 2030  Weight: 150 lb (68.04 kg) 151 lb (68.493 kg) 143 lb 8.3 oz (65.1 kg)    PHYSICAL EXAM  Thin older female in no distress. VSS as recorded. Lungs clear.  Cor reg. Abd  Soft.      Accessory Clinical Findings  CBC  Basename 06/20/12 0312 06/19/12 1523  WBC 8.0 7.5  NEUTROABS -- 4.5  HGB 12.2 13.9  HCT 37.5 41.2  MCV 93.1 91.6  PLT 249 270   Basic Metabolic Panel  Basename 06/20/12 0312 06/19/12 1524  NA 142 143  K 3.1* 4.0  CL 106 105  CO2 29 27  GLUCOSE 92 109*    BUN 7 5*  CREATININE 0.50 0.51  CALCIUM 8.5 9.2  MG -- --  PHOS -- --   Liver Function Tests  Basename 06/19/12 1524  AST 20  ALT 16  ALKPHOS 110  BILITOT 0.3  PROT 6.6  ALBUMIN 3.4*   No results found for this basename: LIPASE:2,AMYLASE:2 in the last 72 hours Cardiac Enzymes  Basename 06/20/12 0312 06/19/12 1953 06/19/12 1530  CKTOTAL -- -- --  CKMB -- -- --  CKMBINDEX -- -- --  TROPONINI <0.30 <0.30 <0.30   BNP No components found with this basename: POCBNP:3 D-Dimer No results found for this basename: DDIMER:2 in the last 72 hours Hemoglobin A1C  Basename 06/19/12 1953  HGBA1C 5.7*   Fasting Lipid Panel No results found for this basename: CHOL,HDL,LDLCALC,TRIG,CHOLHDL,LDLDIRECT in the last 72 hours Thyroid Function Tests No results found for this basename: TSH,T4TOTAL,FREET3,T3FREE,THYROIDAB in the last 72 hours    ECG  pending  Radiology/Studies  Dg Chest 2 View  06/05/2012  *RADIOLOGY REPORT*  Clinical Data: 60-year-old female smoker.  Bronchitis.  CHEST -   2 VIEW  Comparison: 06/07/2011 and earlier.  Findings: Stable lung volumes.  Stable cardiac size and mediastinal contours.  Stable lung markings.  No pneumothorax, pulmonary edema, pleural effusion or acute pulmonary opacity. No acute osseous abnormality identified.    Cervical ACDF hardware re-identified.  IMPRESSION: No acute cardiopulmonary abnormality.   Original Report Authenticated By: H. Hall III, M.D.    Dg Chest Portable 1 View  06/19/2012  *RADIOLOGY REPORT*  Clinical Data: Chest pain, left neck pain  PORTABLE CHEST - 1 VIEW  Comparison: 06/05/2012  Findings: Cardiomediastinal silhouette is stable.  No acute infiltrate or pleural effusion.  No pulmonary edema.  Mild degenerative changes thoracic spine.  IMPRESSION: No active disease.   Original Report Authenticated By: Liviu Pop, M.D.     ASSESSMENT AND PLAN   1.  Chest pain  - etiology unclear but still has at rest.  K is low and will  replace.  Hold beta blockers.  Increase IVF.  Plan cath study later in morning after K supplement.  Patient wants to proceed.   Signed, Ronya Gilcrest MD, FACC, FSCAI 

## 2012-06-20 NOTE — Care Management Note (Signed)
    Page 1 of 1   06/20/2012     8:40:24 AM   CARE MANAGEMENT NOTE 06/20/2012  Patient:  Diane Williams,Diane Williams   Account Number:  [id number]  Date Initiated:  06/20/2012  Documentation initiated by:  Junius Creamer  Subjective/Objective Assessment:   adm w ch pain     Action/Plan:   lives alone, pt dr Lorin Picket nadel   Anticipated DC Date:     Anticipated DC Plan:        DC Planning Services  CM consult      Choice offered to / List presented to:             Status of service:   Medicare Important Message given?   (If response is "NO", the following Medicare IM given date fields will be blank) Date Medicare IM given:   Date Additional Medicare IM given:    Discharge Disposition:  HOME/SELF CARE  Per UR Regulation:  Reviewed for med. necessity/level of care/duration of stay  If discussed at Long Length of Stay Meetings, dates discussed:    Comments:  12/3 8:40a debbie Hazely Sealey rn,bsn

## 2012-06-20 NOTE — Consult Note (Signed)
Reason for Consult:Neck pain Referring Physician: cardiology  Diane Williams is an 61 y.o. female.  HPI: 61 yo female who reportedly had neck surgery by me back a few years ago. No records available on epic. Two days ago she developed neck pain that went down her left arm. She came to [facility] and was admitted for cardiac work up. This was not very severe, so we were asked to see her for possible spinal etiology.   Past Medical History  Diagnosis Date  . Iritis   . History of bronchitis   . GERD (gastroesophageal reflux disease)   . Diverticulosis of colon   . IBS (irritable bowel syndrome)   . Unspecified disorder of biliary tract   . UTI (lower urinary tract infection)   . Interstitial cystitis   . Lumbar back pain   . Fibromyalgia   . Headache   . Dysthymia   . History of shingles     Past Surgical History  Procedure Date  . Appendectomy   . Hysterectomy and right oopherectomy   . Cholecystectomy   . Common bile duct stent by ercp w/sphincterotomy   . Tonsillectomy   . Tubal ligation   . Cspine surgery 05/2010    by Dr. Gerlene Fee    History reviewed. No pertinent family history.  Social History:  reports that she has been smoking Cigarettes.  She has been smoking about .5 packs per day. She has never used smokeless tobacco. She reports that she does not drink alcohol or use illicit drugs.  Allergies:  Allergies  Allergen Reactions  . Metoclopramide Hcl     REACTION: tremors  . Prochlorperazine Edisylate     REACTION: tremors  . Varenicline Tartrate     REACTION: nausea    Medications: I have reviewed the patient's current medications.  Results for orders placed during the hospital encounter of 06/19/12 (from the past 48 hour(s))  URINALYSIS, ROUTINE W REFLEX MICROSCOPIC     Status: Abnormal   Collection Time   06/19/12  2:38 PM      Component Value Range Comment   Color, Urine YELLOW  YELLOW    APPearance CLEAR  CLEAR    Specific Gravity, Urine 1.008  1.005 -  1.030    pH 6.0  5.0 - 8.0    Glucose, UA NEGATIVE  NEGATIVE mg/dL    Hgb urine dipstick NEGATIVE  NEGATIVE    Bilirubin Urine NEGATIVE  NEGATIVE    Ketones, ur NEGATIVE  NEGATIVE mg/dL    Protein, ur NEGATIVE  NEGATIVE mg/dL    Urobilinogen, UA 0.2  0.0 - 1.0 mg/dL    Nitrite NEGATIVE  NEGATIVE    Leukocytes, UA TRACE (*) NEGATIVE   URINE MICROSCOPIC-ADD ON     Status: Normal   Collection Time   06/19/12  2:38 PM      Component Value Range Comment   Squamous Epithelial / LPF RARE  RARE    WBC, UA 0-2  <3 WBC/hpf    Bacteria, UA RARE  RARE    Urine-Other RARE YEAST     CBC WITH DIFFERENTIAL     Status: Normal   Collection Time   06/19/12  3:23 PM      Component Value Range Comment   WBC 7.5  4.0 - 10.5 K/uL    RBC 4.50  3.87 - 5.11 MIL/uL    Hemoglobin 13.9  12.0 - 15.0 g/dL    HCT 40.9  81.1 - 91.4 %    MCV 91.6  78.0 - 100.0 fL    MCH 30.9  26.0 - 34.0 pg    MCHC 33.7  30.0 - 36.0 g/dL    RDW 16.1  09.6 - 04.5 %    Platelets 270  150 - 400 K/uL    Neutrophils Relative 59  43 - 77 %    Neutro Abs 4.5  1.7 - 7.7 K/uL    Lymphocytes Relative 32  12 - 46 %    Lymphs Abs 2.4  0.7 - 4.0 K/uL    Monocytes Relative 7  3 - 12 %    Monocytes Absolute 0.5  0.1 - 1.0 K/uL    Eosinophils Relative 1  0 - 5 %    Eosinophils Absolute 0.1  0.0 - 0.7 K/uL    Basophils Relative 0  0 - 1 %    Basophils Absolute 0.0  0.0 - 0.1 K/uL   COMPREHENSIVE METABOLIC PANEL     Status: Abnormal   Collection Time   06/19/12  3:24 PM      Component Value Range Comment   Sodium 143  135 - 145 mEq/L    Potassium 4.0  3.5 - 5.1 mEq/L    Chloride 105  96 - 112 mEq/L    CO2 27  19 - 32 mEq/L    Glucose, Bld 109 (*) 70 - 99 mg/dL    BUN 5 (*) 6 - 23 mg/dL    Creatinine, Ser 4.09  0.50 - 1.10 mg/dL    Calcium 9.2  8.4 - 81.1 mg/dL    Total Protein 6.6  6.0 - 8.3 g/dL    Albumin 3.4 (*) 3.5 - 5.2 g/dL    AST 20  0 - 37 U/L    ALT 16  0 - 35 U/L    Alkaline Phosphatase 110  39 - 117 U/L    Total  Bilirubin 0.3  0.3 - 1.2 mg/dL    GFR calc non Af Amer >90  >90 mL/min    GFR calc Af Amer >90  >90 mL/min   TROPONIN I     Status: Normal   Collection Time   06/19/12  3:30 PM      Component Value Range Comment   Troponin I <0.30  <0.30 ng/mL   TROPONIN I     Status: Normal   Collection Time   06/19/12  7:53 PM      Component Value Range Comment   Troponin I <0.30  <0.30 ng/mL   HEMOGLOBIN A1C     Status: Abnormal   Collection Time   06/19/12  7:53 PM      Component Value Range Comment   Hemoglobin A1C 5.7 (*) <5.7 %    Mean Plasma Glucose 117 (*) <117 mg/dL   MRSA PCR SCREENING     Status: Abnormal   Collection Time   06/19/12  8:35 PM      Component Value Range Comment   MRSA by PCR POSITIVE (*) NEGATIVE   HEPARIN LEVEL (UNFRACTIONATED)     Status: Abnormal   Collection Time   06/19/12 10:36 PM      Component Value Range Comment   Heparin Unfractionated 0.13 (*) 0.30 - 0.70 IU/mL   TROPONIN I     Status: Normal   Collection Time   06/20/12  3:12 AM      Component Value Range Comment   Troponin I <0.30  <0.30 ng/mL   BASIC METABOLIC PANEL     Status: Abnormal  Collection Time   06/20/12  3:12 AM      Component Value Range Comment   Sodium 142  135 - 145 mEq/L    Potassium 3.1 (*) 3.5 - 5.1 mEq/L DELTA CHECK NOTED   Chloride 106  96 - 112 mEq/L    CO2 29  19 - 32 mEq/L    Glucose, Bld 92  70 - 99 mg/dL    BUN 7  6 - 23 mg/dL    Creatinine, Ser 2.13  0.50 - 1.10 mg/dL    Calcium 8.5  8.4 - 08.6 mg/dL    GFR calc non Af Amer >90  >90 mL/min    GFR calc Af Amer >90  >90 mL/min   CBC     Status: Normal   Collection Time   06/20/12  3:12 AM      Component Value Range Comment   WBC 8.0  4.0 - 10.5 K/uL    RBC 4.03  3.87 - 5.11 MIL/uL    Hemoglobin 12.2  12.0 - 15.0 g/dL    HCT 57.8  46.9 - 62.9 %    MCV 93.1  78.0 - 100.0 fL    MCH 30.3  26.0 - 34.0 pg    MCHC 32.5  30.0 - 36.0 g/dL    RDW 52.8  41.3 - 24.4 %    Platelets 249  150 - 400 K/uL   HEPARIN LEVEL  (UNFRACTIONATED)     Status: Normal   Collection Time   06/20/12  8:14 AM      Component Value Range Comment   Heparin Unfractionated 0.52  0.30 - 0.70 IU/mL   PROTIME-INR     Status: Normal   Collection Time   06/20/12  8:14 AM      Component Value Range Comment   Prothrombin Time 14.2  11.6 - 15.2 seconds    INR 1.11  0.00 - 1.49     Dg Chest Portable 1 View  06/19/2012  *RADIOLOGY REPORT*  Clinical Data: Chest pain, left neck pain  PORTABLE CHEST - 1 VIEW  Comparison: 06/05/2012  Findings: Cardiomediastinal silhouette is stable.  No acute infiltrate or pleural effusion.  No pulmonary edema.  Mild degenerative changes thoracic spine.  IMPRESSION: No active disease.   Original Report Authenticated By: Natasha Mead, M.D.     Review of systems not obtained due to patient factors. Blood pressure 92/58, pulse 71, temperature 97.7 F (36.5 C), temperature source Oral, resp. rate 16, height 5' 5.5" (1.664 m), weight 65.1 kg (143 lb 8.3 oz), SpO2 98.00%. The patient is awake, alert, oriented. her reflexes are decreased but equal. Her strength on upper extremities is 5/5. Her gait is not tested.   Assessment/Plan: She now has only neck pain and some neck tenderness. i think she should be started on naprosyn 500mg  bid, and Flexeril, 10 mg tid. She can be discharged anytime with follow up with me in the office next week.  Reinaldo Meeker, MD 06/20/2012, 12:01 PM

## 2012-06-21 ENCOUNTER — Encounter (HOSPITAL_COMMUNITY): Payer: Self-pay | Admitting: Physician Assistant

## 2012-06-21 DIAGNOSIS — R079 Chest pain, unspecified: Secondary | ICD-10-CM

## 2012-06-21 LAB — BASIC METABOLIC PANEL
BUN: 5 mg/dL — ABNORMAL LOW (ref 6–23)
Calcium: 8.7 mg/dL (ref 8.4–10.5)
Creatinine, Ser: 0.5 mg/dL (ref 0.50–1.10)
GFR calc Af Amer: >90 mL/min (ref >90)
GFR calc non Af Amer: >90 mL/min (ref >90)

## 2012-06-21 MED ORDER — CYCLOBENZAPRINE HCL 5 MG PO TABS: 5.0000 mg | ORAL_TABLET | Freq: Three times a day (TID) | ORAL | Status: DC | PRN

## 2012-06-21 MED ORDER — POTASSIUM CHLORIDE CRYS ER 20 MEQ PO TBCR
40.0000 meq | EXTENDED_RELEASE_TABLET | Freq: Once | ORAL | Status: AC
  Administered 2012-06-21: 40 meq via ORAL
  Filled 2012-06-21: qty 2

## 2012-06-21 MED ORDER — ASPIRIN 81 MG PO TBEC: 81.0000 mg | DELAYED_RELEASE_TABLET | Freq: Every day | ORAL | Status: DC

## 2012-06-21 MED ORDER — CYCLOBENZAPRINE HCL 10 MG PO TABS: 5.0000 mg | ORAL_TABLET | Freq: Three times a day (TID) | ORAL | Status: DC | PRN

## 2012-06-21 MED ORDER — POTASSIUM CHLORIDE CRYS ER 20 MEQ PO TBCR
EXTENDED_RELEASE_TABLET | ORAL | Status: AC
  Filled 2012-06-21: qty 2

## 2012-06-21 MED ORDER — POTASSIUM CHLORIDE ER 10 MEQ PO TBCR: 10.0000 meq | EXTENDED_RELEASE_TABLET | Freq: Every day | ORAL | Status: DC

## 2012-06-21 MED ORDER — POTASSIUM CHLORIDE CRYS ER 20 MEQ PO TBCR
40.0000 meq | EXTENDED_RELEASE_TABLET | Freq: Once | ORAL | Status: AC
  Administered 2012-06-21: 09:00:00 40 meq via ORAL

## 2012-06-21 MED ORDER — NAPROXEN SODIUM 220 MG PO TABS
220.0000 mg | ORAL_TABLET | Freq: Every day | ORAL | Status: DC | PRN
Start: 2012-06-21 — End: 2020-03-14

## 2012-06-21 NOTE — Discharge Summary (Signed)
Discharge Summary   Patient ID: Diane Williams MRN: [id number], DOB/AGE: 61-Jul-1952 61 y.o. Admit date: 06/19/2012 D/C date:     06/21/2012  Primary Cardiologist: Riley Kill  Primary Discharge Diagnoses:  1. Chest pain with moderate risk for cardiac etiology - cath this admission without obstructive lesion 2. 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 Chronic neck pain - seen by neurosurgery this admission who recommended NSAIDs/Flexeril - s/p diskectomy on pain medications, followed by pain management 4. Postherpetic neuralgia  Secondary Discharge Diagnoses:  1. Anxiety/depression 2. HLD 3. GERD 4. Chronic venous insufficiency 5. H/o persistently elevated LFTs (s/p sphincterotomy for biliary sludge with normalization, then subsuquent elevation) 6. Tobacco abuse 7. Iritis 8. Fibromyalgia 9. Diverticulosis of colon 10. IBS 11. Interstitial cystitis 12. Dysthymia 13. History of shingles  Hospital Course: Ms. Diane Williams is a 61yo female with a history of tobacco abuse, HLD, GERD, chronic neck pain (s/p diskectomy on multiple pain medications followed by pain management), anxiety/depression, chronic venous insufficiency who presented to [facility] ED complaining of chest pain. She reported decorating her Christmas tree the day prior to admission and every time should would stand up, she would experience bilateral neck aching and shortness of breath. This occurred intermittently throughout the day. The day of admission she experienced left-sided dull constant chest pain radiating to her neck and and left arm with associated shortness of breath. She endorsed diaphoresis, nausea, PND, orthopnea, LEE, palpitations. Some reminiscence to post-herpetic neuralgia pain. Some aggravation on palpation. She did reported increased holiday stress. In the ED, EKG reveals NSR, no ischemic changes. Portable CXR without acute  process. NTG provided no relief, but morphine helped. Cardiac enzymes remained negative x 3. She was not tachycardic, tachypnic or hypoxic. Cardiac cath was recommended. She underwent this procedure 06/20/12 demonstrating 1. Calcified plaque in the LAD without focal narrowing  2. 40-50% narrowing in the proximal OM  3. Minor plaque in the RCA as noted  4. Preserved LV function. There was no clear coronary lesion contributing to her symptoms. She has a modest OM lesion but not likely the cause of her chest pain. She was seen by neurosurgery for her neck pain and neck tenderness. Dr. Gerlene Fee recommended naproxen & Flexeril. Dr. Riley Kill has recommended a 1 week rx for Flexeril at 5mg  TID and to use Aleve only sporadically (taking into account CAD/aspirin use). She was instructed not to operate heavy machinery including driving while taking the flexeril until she knows how it affects her. Beta blockers were held this admission due to soft blood pressure. Dr. Riley Kill has recommended to consider statin as an outpatient. He has seen and examined the patient today and feels she is stable for discharge.  With regards to her hypokalemia, she was given repletion prior to discharge. Dr. Riley Kill has recommended BMET on 12/6 and KCl daily for 2 weeks.  Discharge Vitals: Blood pressure 113/67, pulse 67, temperature 98.5 F (36.9 C), temperature source Oral, resp. rate 14, height 5' 5.5" (1.664 m), weight 150 lb 12.7 oz (68.4 kg), SpO2 98.00%.  Labs: Lab Results  Component Value Date   WBC 8.0 06/20/2012   HGB 12.2 06/20/2012   HCT 37.5 06/20/2012   MCV 93.1 06/20/2012   PLT 249 06/20/2012     Lab 06/21/12 0510 06/19/12 1524  NA 140 --  K 3.1* --  CL 104 --  CO2 27 --  BUN 5* --  CREATININE 0.50 --  CALCIUM 8.7 --  PROT -- 6.6  BILITOT -- 0.3  ALKPHOS -- 110  ALT -- 16  AST -- 20  GLUCOSE 109* --    Basename 06/20/12 0312 06/19/12 1953 06/19/12 1530  CKTOTAL -- -- --  CKMB -- -- --    TROPONINI <0.30 <0.30 <0.30   Lab Results  Component Value Date   CHOL 209* 06/05/2012   HDL 68.20 06/05/2012   LDLCALC 83 01/27/2011   TRIG 73.0 06/05/2012     Diagnostic Studies/Procedures   1. Cardiac catheterization this admission, please see full report and above for summary. 2. Dg Chest 2 View11/18/2013  *RADIOLOGY REPORT*  Clinical Data: 61 year old female smoker.  Bronchitis.  CHEST - 2 VIEW  Comparison: 06/07/2011 and earlier.  Findings: Stable lung volumes.  Stable cardiac size and mediastinal contours.  Stable lung markings.  No pneumothorax, pulmonary edema, pleural effusion or acute pulmonary opacity. No acute osseous abnormality identified.    Cervical ACDF hardware re-identified.  IMPRESSION: No acute cardiopulmonary abnormality.   Original Report Authenticated By: Erskine Speed, M.D.   3. Dg Chest Portable 1 View 06/19/2012  *RADIOLOGY REPORT*  Clinical Data: Chest pain, left neck pain  PORTABLE CHEST - 1 VIEW  Comparison: 06/05/2012  Findings: Cardiomediastinal silhouette is stable.  No acute infiltrate or pleural effusion.  No pulmonary edema.  Mild degenerative changes thoracic spine.  IMPRESSION: No active disease.   Original Report Authenticated By: Natasha Mead, M.D.     Discharge Medications   Current Discharge Medication List    START taking these medications   Details  aspirin EC 81 MG EC tablet Take 1 tablet (81 mg total) by mouth daily.    cyclobenzaprine (FLEXERIL) 5 MG tablet Take 1 tablet (5 mg total) by mouth 3 (three) times daily as needed (muscle spasms/neck pain). Qty: 21 tablet, Refills: 0    naproxen sodium (ALEVE) 220 MG tablet Take 1-2 tablets (220-440 mg total) by mouth daily as needed (for neck pain). Take with food.   Comments: You can try this medicine for your neck pain, but Dr. Riley Kill would like you to use only sporadically. This medicine can increase the risk of stomach bleeding in patients taking medicines like aspirin. Stop taking if this  causes you stomach upset.    potassium chloride (K-DUR) 10 MEQ tablet Take 1 tablet (10 mEq total) by mouth daily. For 2 weeks Qty: 14 tablet, Refills: 0      CONTINUE these medications which have NOT CHANGED   Details  amitriptyline (ELAVIL) 25 MG tablet Take 3 tablets by mouth at bedtime     gabapentin (NEURONTIN) 100 MG capsule Take 1 capsule (100 mg total) by mouth at bedtime. Start with 1 by mouth at bedtime and increase slowly to 3 tablets at bedtime     LORazepam (ATIVAN) 0.5 MG tablet Take 0.25 mg by mouth every 8 (eight) hours as needed. For anxiety    Multiple Vitamins-Minerals (WOMENS MULTIVITAMIN PLUS) TABS Take 1 tablet by mouth daily.      oxyCODONE (ROXICODONE) 5 MG immediate release tablet Take 1 tablet (5 mg total) by mouth 3 (three) times daily as needed for pain.     traMADol (ULTRAM) 50 MG tablet Take 1 tablet (50 mg total) by mouth every 8 (eight) hours as needed for pain.     vitamin B-12 (CYANOCOBALAMIN) 1000 MCG tablet Take 1,000 mcg by mouth daily.        Disposition   The patient  will be discharged in stable condition to home. Discharge Orders    Future Appointments: Provider: Department: Dept Phone: Center:   06/23/2012 10:05 AM [facility] Main Office [place]) [phone] [facility]   07/03/2012 9:50 AM Lorin Picket Moishe Spice, PA [facility] Main Office [place]) [phone] [facility]   12/04/2012 11:30 AM Michele Mcalpine, MD [facility] [phone] None     Future Orders Please Complete By Expires   Diet - low sodium heart healthy      Increase activity slowly      Comments:   No driving for 2 days. No lifting over 5 lbs for 1 week. No sexual activity for 1 week. Keep procedure site clean & dry. If you notice increased pain, swelling, bleeding or pus, call/return!  You may shower, but no soaking baths/hot tubs/pools for 1 week.     Follow-up Information    Follow up with Reinaldo Meeker, MD. (Neurosurgery -  Please call for appointment to be seen in 1 week)    Contact information:   [address] [place] [address] [phone]       Follow up with [facility]. (Friday 06/23/12 for labwork (can come anytime between 7:30am-4:45pm))    Contact information:   [facility] [address] [place] [address] [phone]      Follow up with Tereso Newcomer, PA. (07/03/12 at 9:50am)    Contact information:   [facility] [address] [place] [address] [phone]            Duration of Discharge Encounter: Greater than 30 minutes including physician and PA time.  Signed, Kriste Basque Velma Hanna PA-C 06/21/2012, 1:03 PM

## 2012-06-21 NOTE — Progress Notes (Addendum)
Subjective:  Stable at present.  Feeling some better.  Chest pain resolved.  Neck still bothering her.    Objective:  Vital Signs in the last 24 hours: Temp:  [97.9 F (36.6 C)-98.6 F (37 C)] 98.5 F (36.9 C) (12/04 0820) Pulse Rate:  [67-79] 67  (12/04 0820) Resp:  [14-18] 14  (12/04 0820) BP: (86-115)/(45-67) 113/67 mmHg (12/04 0820) SpO2:  [98 %] 98 % (12/04 0820) Weight:  [150 lb 12.7 oz (68.4 kg)] 150 lb 12.7 oz (68.4 kg) (12/04 0545)  Intake/Output from previous day: 12/03 0701 - 12/04 0700 In: 2142.1 [P.O.:900; I.V.:1242.1] Out: 150 [Urine:150]   Physical Exam: General: Well developed, well nourished, in no acute distress. Head:  Normocephalic and atraumatic. Lungs: Clear to auscultation and percussion. Heart: Normal S1 and S2.  No murmur, rubs or gallops.  Pulses: R groin is stable  Extremities: No clubbing or cyanosis. No edema. Neurologic: Alert and oriented x 3.    Lab Results:  [facility] 06/20/12 0312 06/19/12 1523  WBC 8.0 7.5  HGB 12.2 13.9  PLT 249 270    Basename 06/21/12 0510 06/20/12 0312  NA 140 142  K 3.1* 3.1*  CL 104 106  CO2 27 29  GLUCOSE 109* 92  BUN 5* 7  CREATININE 0.50 0.50    Basename 06/20/12 0312 06/19/12 1953  TROPONINI <0.30 <0.30   Hepatic Function Panel  Basename 06/19/12 1524  PROT 6.6  ALBUMIN 3.4*  AST 20  ALT 16  ALKPHOS 110  BILITOT 0.3  BILIDIR --  IBILI --   No results found for this basename: CHOL in the last 72 hours No results found for this basename: PROTIME in the last 72 hours  Imaging: Dg Chest Portable 1 View  06/19/2012  *RADIOLOGY REPORT*  Clinical Data: Chest pain, left neck pain  PORTABLE CHEST - 1 VIEW  Comparison: 06/05/2012  Findings: Cardiomediastinal silhouette is stable.  No acute infiltrate or pleural effusion.  No pulmonary edema.  Mild degenerative changes thoracic spine.  IMPRESSION: No active disease.   Original Report Authenticated By: Natasha Mead, M.D.     ssessment/Plan:  Patient  Active Hospital Problem List: Chest pain with moderate risk for cardiac etiology (06/19/2012)   Assessment: see cath report.  Modest OM lesion but not likely cause of CP   Plan: monitor.  DC smoking.  Consider statin as OP GERD (05/18/2007)   Assessment: moderate   Plan: consider PPI  Tobacco abuse (06/19/2012)   Assessment: discussed with patient about strategies   Plan: continue encouragement Chronic neck pain (06/19/2012)   Assessment: appreciate NS consult   Plan: Will give Flexeril 5 mg tid prn  1 week prescription Hypokalemia    K now 40 meq and repeat in four hours.     Needs repeat BMET Friday in office, and daily for two weeks.       [name], MD, [facility], FSCAI 06/21/2012, 8:49 AM

## 2012-06-22 ENCOUNTER — Other Ambulatory Visit: Payer: Self-pay | Admitting: *Deleted

## 2012-06-22 DIAGNOSIS — J40 Bronchitis, not specified as acute or chronic: Secondary | ICD-10-CM

## 2012-06-22 DIAGNOSIS — R079 Chest pain, unspecified: Secondary | ICD-10-CM

## 2012-06-23 ENCOUNTER — Other Ambulatory Visit (INDEPENDENT_AMBULATORY_CARE_PROVIDER_SITE_OTHER): Payer: Medicare Other

## 2012-06-23 DIAGNOSIS — R079 Chest pain, unspecified: Secondary | ICD-10-CM

## 2012-06-23 DIAGNOSIS — J40 Bronchitis, not specified as acute or chronic: Secondary | ICD-10-CM

## 2012-06-23 LAB — BASIC METABOLIC PANEL
BUN: 7 mg/dL (ref 6–23)
Calcium: 9.3 mg/dL (ref 8.4–10.5)
Creatinine, Ser: 0.6 mg/dL (ref 0.4–1.2)

## 2012-06-26 ENCOUNTER — Other Ambulatory Visit: Payer: Self-pay

## 2012-06-26 DIAGNOSIS — R079 Chest pain, unspecified: Secondary | ICD-10-CM

## 2012-07-03 ENCOUNTER — Telehealth: Payer: Self-pay | Admitting: *Deleted

## 2012-07-03 ENCOUNTER — Other Ambulatory Visit (INDEPENDENT_AMBULATORY_CARE_PROVIDER_SITE_OTHER): Payer: Medicare Other

## 2012-07-03 ENCOUNTER — Ambulatory Visit (INDEPENDENT_AMBULATORY_CARE_PROVIDER_SITE_OTHER): Payer: Medicare Other | Admitting: Physician Assistant

## 2012-07-03 ENCOUNTER — Encounter: Payer: Self-pay | Admitting: Physician Assistant

## 2012-07-03 VITALS — BP 120/68 | HR 82 | Ht 65.5 in | Wt 151.6 lb

## 2012-07-03 DIAGNOSIS — E876 Hypokalemia: Secondary | ICD-10-CM

## 2012-07-03 DIAGNOSIS — M542 Cervicalgia: Secondary | ICD-10-CM

## 2012-07-03 DIAGNOSIS — R0989 Other specified symptoms and signs involving the circulatory and respiratory systems: Secondary | ICD-10-CM

## 2012-07-03 DIAGNOSIS — I2510 Atherosclerotic heart disease of native coronary artery without angina pectoris: Secondary | ICD-10-CM

## 2012-07-03 DIAGNOSIS — R0602 Shortness of breath: Secondary | ICD-10-CM

## 2012-07-03 DIAGNOSIS — E785 Hyperlipidemia, unspecified: Secondary | ICD-10-CM

## 2012-07-03 DIAGNOSIS — R079 Chest pain, unspecified: Secondary | ICD-10-CM

## 2012-07-03 LAB — BASIC METABOLIC PANEL
Calcium: 9.1 mg/dL (ref 8.4–10.5)
GFR: 93.48 mL/min (ref >60.00)
Potassium: 3.6 mEq/L (ref 3.5–5.1)
Sodium: 137 mEq/L (ref 135–145)

## 2012-07-03 LAB — D-DIMER, QUANTITATIVE: D-Dimer, Quant: 0.4 ug/mL-FEU (ref 0.00–0.48)

## 2012-07-03 MED ORDER — PRAVASTATIN SODIUM 20 MG PO TABS: 20.0000 mg | ORAL_TABLET | Freq: Every evening | ORAL | Status: DC

## 2012-07-03 NOTE — Telephone Encounter (Signed)
pt notified about d-dimer results normal, pt verbalized understanding

## 2012-07-03 NOTE — Patient Instructions (Addendum)
START PRAVASTATIN 20 MG 1 TABLET EVERY NIGHT AT BEDTIME  LAB TODAY; BMET, D-DIMER  08/18/12 FASTING LIPID AND LIVER PANEL   You have been referred to DR. NADEL DX DYSPNEA  Your physician recommends that you schedule a follow-up appointment in: 6 WEEKS WITH DR. Riley Kill

## 2012-07-03 NOTE — Progress Notes (Signed)
[address], [address] [place], [place]  [address] Phone: [phone], Fax:  [phone]  Date:  07/03/2012   Name:  Diane Williams   DOB:  1950/12/17   MRN:  [id number]  PCP:  Diane Mcalpine, MD  Primary Cardiologist:  Dr.  [name]  Primary Electrophysiologist:  None    History of Present Illness: Diane Williams is a 61 y.o. female who returns for follow up after a recent admission to the hospital with chest pain.  She has a hx of anxiety/depression, HL, GERD, elevated LFTs, fibromyalgia, IBS. Echo 12/11: EF 60-65%, PASP 28-32.   She was admitted 12/2-12/4 with chest pain. She ruled out for myocardial infarction by enzymes. Chest x-ray was unremarkable. Cardiac catheterization was recommended. LHC 06/20/12: LAD 20-30%, proximal OM 40-50%, mid RCA 10%, EF 55-65%. Patient had more neck pain with prior cervical spine surgery. Neurosurgery saw the patient. NSAIDs and muscle relaxers were recommended. Dr. Gerlene Fee recommended outpatient followup with him.  The patient notes continued symptoms since discharge. She describes occasional neck pain that feels as though she is choking. These are not brought on by exertion. She does note associated diaphoresis and difficulty breathing. She also notes exertional shortness of breath over the last 1-2 months. She probably describes class IIb symptoms. She denies syncope. She denies orthopnea or PND. She does note some right lower extremity edema over the last few months.  Labs (11/13):   TC 209, TG 73, HDL 68, LDL 124, TSH 1.55 Labs (12/13):   K 3.6, creatinine 0.6, AST 20, ALT 16, Hgb 12.2  Wt Readings from Last 3 Encounters:  07/03/12 151 lb 9.6 oz (68.765 kg)  06/21/12 150 lb 12.7 oz (68.4 kg)  06/21/12 150 lb 12.7 oz (68.4 kg)     Past Medical History  Diagnosis Date  . Iritis   . History of bronchitis   . GERD (gastroesophageal reflux disease)   . Diverticulosis of colon   . IBS (irritable bowel syndrome)   . Unspecified  disorder of biliary tract   . UTI (lower urinary tract infection)   . Interstitial cystitis   . Lumbar back pain   . Fibromyalgia   . Headache   . Dysthymia   . History of shingles   . CAD (coronary artery disease)     Moderate nonobstructive by cath 06/2012    Current Outpatient Prescriptions  Medication Sig Dispense Refill  . amitriptyline (ELAVIL) 25 MG tablet Take 3 tablets by mouth at bedtime  90 tablet  3  . aspirin EC 81 MG EC tablet Take 1 tablet (81 mg total) by mouth daily.      . cyclobenzaprine (FLEXERIL) 5 MG tablet Take 1 tablet (5 mg total) by mouth 3 (three) times daily as needed (muscle spasms/neck pain).  21 tablet  0  . gabapentin (NEURONTIN) 100 MG capsule Take 1 capsule (100 mg total) by mouth at bedtime. Start with 1 by mouth at bedtime and increase slowly to 3 tablets at bedtime  90 capsule  3  . LORazepam (ATIVAN) 0.5 MG tablet Take 0.25 mg by mouth every 8 (eight) hours as needed. For anxiety      . Multiple Vitamins-Minerals (WOMENS MULTIVITAMIN PLUS) TABS Take 1 tablet by mouth daily.        . naproxen sodium (ALEVE) 220 MG tablet Take 1-2 tablets (220-440 mg total) by mouth daily as needed (for neck pain). Take with food.      [name] oxyCODONE (ROXICODONE) 5 MG immediate  release tablet Take 1 tablet (5 mg total) by mouth 3 (three) times daily as needed for pain.  90 tablet  0  . potassium chloride (K-DUR) 10 MEQ tablet Take 1 tablet (10 mEq total) by mouth daily. For 2 weeks  14 tablet  0  . traMADol (ULTRAM) 50 MG tablet Take 1 tablet (50 mg total) by mouth every 8 (eight) hours as needed for pain.  90 tablet  5  . vitamin B-12 (CYANOCOBALAMIN) 1000 MCG tablet Take 1,000 mcg by mouth daily.        Allergies: Allergies  Allergen Reactions  . Metoclopramide Hcl     REACTION: tremors  . Prochlorperazine Edisylate     REACTION: tremors  . Varenicline Tartrate     REACTION: nausea    Social History:  The patient  reports that she has been smoking Cigarettes.   She has been smoking about .5 packs per day. She has never used smokeless tobacco. She reports that she does not drink alcohol or use illicit drugs.   ROS:  Please see the history of present illness.   She denies fevers, chills, cough, wheezing. She denies dysphagia or odynophagia. She denies dyspepsia.   All other systems reviewed and negative.   PHYSICAL EXAM: VS:  BP 120/68  Pulse 82  Ht 5' 5.5" (1.664 m)  Wt 151 lb 9.6 oz (68.765 kg)  BMI 24.84 kg/m2 Well nourished, well developed, in no acute distress HEENT: normal Neck: no JVD Vascular: No carotid bruits Cardiac:  normal S1, S2; RRR; no murmur Lungs:  clear to auscultation bilaterally, no wheezing, rhonchi or rales Abd: soft, nontender, no hepatomegaly Ext: no edema; right groin without hematoma or bruit  Skin: warm and dry Neuro:  CNs 2-12 intact, no focal abnormalities noted  EKG:  NSR, HR 82, no acute changes      ASSESSMENT AND PLAN:  1. Coronary Artery Disease:   She had mild to moderate nonobstructive disease at cardiac catheterization. This is likely not contributing to her current symptoms. Continue medical therapy. She will remain on aspirin.  2. Hyperlipidemia:   As she does have coronary artery disease noted on cardiac catheterization, she would benefit from statin therapy. She's had some elevated liver enzymes in the past in the setting of extrahepatic obstruction (papillary stenosis). She does have an abdominal ultrasound in her chart that indicates fatty liver infiltration. She would benefit from statin therapy. I suspect she should be able to tolerate a statin.  I will place her on pravastatin 20 mg each bedtime. We will check her lipids and LFTs in 6 weeks.  3. Neck Pain:   Etiology not entirely clear. Treatment indicated by neurosurgery was not helpful. She does not feel that her symptoms have improved with antacid therapy. She has tried Ativan without significant relief. I have recommended she followup with her  PCP and her neurosurgeon.  4. Dyspnea:   She does have significant smoking history. Cessation has been recommended.  Her recent catheterization did not demonstrate significant obstructive coronary artery disease to explain her symptoms. She does note intermittent lower extremity edema. I will check a d-dimer today. If this is abnormal, she will need either a VQ scan or chest CT to rule out pulmonary embolism. Otherwise, she should followup with her PCP for further evaluation of her dyspnea.  5. Hypokalemia:   She needs a followup basic metabolic panel today.  6. Disposition:   Follow up with Dr.  [name] in 6 weeks.  Signed, Tereso Newcomer, PA-C  10:16 AM 07/03/2012

## 2012-07-04 ENCOUNTER — Telehealth: Payer: Self-pay | Admitting: Pulmonary Disease

## 2012-07-04 ENCOUNTER — Telehealth: Payer: Self-pay | Admitting: *Deleted

## 2012-07-04 MED ORDER — OXYCODONE HCL 5 MG PO TABS: 5.0000 mg | ORAL_TABLET | Freq: Three times a day (TID) | ORAL | Status: DC | PRN

## 2012-07-04 NOTE — Telephone Encounter (Signed)
pt notified about lab results today w/verbal understanding

## 2012-07-04 NOTE — Telephone Encounter (Signed)
lmtcb x1 for pt. 

## 2012-07-04 NOTE — Telephone Encounter (Signed)
Mobile # [phone] entered in error. Pls call pt at the home number.

## 2012-07-04 NOTE — Telephone Encounter (Signed)
Message copied by Tarri Fuller on Tue Jul 04, 2012 11:12 AM ------      Message from: [place], [place] T      Created: Tue Jul 04, 2012  8:08 AM       Potassium and kidney function look good.      DDimer negative.      Continue with current treatment plan.      Tereso Newcomer, PA-C  4:49 PM 06/01/2012

## 2012-07-04 NOTE — Telephone Encounter (Signed)
Per SN---this is not his doings---this is Pension scheme manager issue.   We can refill this medication for her but her new PCP will need to start to fill this for her.  Will print this rx off and she can pick this up when she comes in on 12/19 to see TP.  thanks

## 2012-07-04 NOTE — Telephone Encounter (Signed)
Pt aware that [facility] assigns the PCP and she will need to contact them regarding this. Also, pt says SN has been filling her Oxycodone 5 mg for her and it is time for a new prescription. This was last written on 06/05/12 for #90. Pt would like to pick this up when heere for OV with TP on Thurs., 07/06/12. Pls advise.

## 2012-07-04 NOTE — Telephone Encounter (Signed)
2nd Issue:   Pt is also requesting a written refill on oxycodone when she has her appt w/ TP on 12/19.  Diane Williams

## 2012-07-05 NOTE — Telephone Encounter (Signed)
Spoke with pt-aware of Medicaid changes PCP not him and RX with Shanda Bumps to give to patient at appt with TP tomorrow.

## 2012-07-06 ENCOUNTER — Ambulatory Visit: Payer: Medicare Other | Admitting: Adult Health

## 2012-07-07 NOTE — Discharge Summary (Signed)
See my note.  Patient is ready for dc and will be followed up in the office.

## 2012-07-10 ENCOUNTER — Other Ambulatory Visit: Payer: Medicare Other

## 2012-08-18 ENCOUNTER — Other Ambulatory Visit: Payer: Medicare Other

## 2012-08-21 ENCOUNTER — Other Ambulatory Visit: Payer: Medicare Other

## 2012-08-24 ENCOUNTER — Ambulatory Visit: Payer: Medicare Other | Admitting: Cardiology

## 2012-11-21 ENCOUNTER — Other Ambulatory Visit: Payer: Self-pay | Admitting: *Deleted

## 2012-11-21 MED ORDER — POTASSIUM CHLORIDE ER 10 MEQ PO TBCR: 10.0000 meq | EXTENDED_RELEASE_TABLET | Freq: Every day | ORAL | Status: DC

## 2012-12-04 ENCOUNTER — Ambulatory Visit: Payer: Medicare Other | Admitting: Pulmonary Disease

## 2013-01-16 ENCOUNTER — Inpatient Hospital Stay (HOSPITAL_COMMUNITY)
Admission: EM | Admit: 2013-01-16 | Discharge: 2013-01-18 | DRG: 072 | Disposition: A | Discharge status: Home / Self Care | Payer: Medicare Other | Attending: Internal Medicine | Admitting: Internal Medicine

## 2013-01-16 ENCOUNTER — Emergency Department (HOSPITAL_COMMUNITY): Payer: Medicare Other

## 2013-01-16 DIAGNOSIS — K589 Irritable bowel syndrome without diarrhea: Secondary | ICD-10-CM

## 2013-01-16 DIAGNOSIS — R4182 Altered mental status, unspecified: Secondary | ICD-10-CM

## 2013-01-16 DIAGNOSIS — R5381 Other malaise: Secondary | ICD-10-CM

## 2013-01-16 DIAGNOSIS — F419 Anxiety disorder, unspecified: Secondary | ICD-10-CM

## 2013-01-16 DIAGNOSIS — K5289 Other specified noninfective gastroenteritis and colitis: Secondary | ICD-10-CM

## 2013-01-16 DIAGNOSIS — R51 Headache: Secondary | ICD-10-CM

## 2013-01-16 DIAGNOSIS — K5730 Diverticulosis of large intestine without perforation or abscess without bleeding: Secondary | ICD-10-CM

## 2013-01-16 DIAGNOSIS — K625 Hemorrhage of anus and rectum: Secondary | ICD-10-CM

## 2013-01-16 DIAGNOSIS — Z720 Tobacco use: Secondary | ICD-10-CM

## 2013-01-16 DIAGNOSIS — M542 Cervicalgia: Secondary | ICD-10-CM

## 2013-01-16 DIAGNOSIS — R079 Chest pain, unspecified: Secondary | ICD-10-CM

## 2013-01-16 DIAGNOSIS — Z860100 Personal history of colon polyps, unspecified: Secondary | ICD-10-CM

## 2013-01-16 DIAGNOSIS — IMO0001 Reserved for inherently not codable concepts without codable children: Secondary | ICD-10-CM

## 2013-01-16 DIAGNOSIS — T50901A Poisoning by unspecified drugs, medicaments and biological substances, accidental (unintentional), initial encounter: Secondary | ICD-10-CM

## 2013-01-16 DIAGNOSIS — M545 Low back pain, unspecified: Secondary | ICD-10-CM

## 2013-01-16 DIAGNOSIS — Z79899 Other long term (current) drug therapy: Secondary | ICD-10-CM

## 2013-01-16 DIAGNOSIS — K219 Gastro-esophageal reflux disease without esophagitis: Secondary | ICD-10-CM

## 2013-01-16 DIAGNOSIS — Z7982 Long term (current) use of aspirin: Secondary | ICD-10-CM

## 2013-01-16 DIAGNOSIS — F32A Depression, unspecified: Secondary | ICD-10-CM

## 2013-01-16 DIAGNOSIS — N3010 Interstitial cystitis (chronic) without hematuria: Secondary | ICD-10-CM

## 2013-01-16 DIAGNOSIS — R945 Abnormal results of liver function studies: Secondary | ICD-10-CM

## 2013-01-16 DIAGNOSIS — F39 Unspecified mood [affective] disorder: Secondary | ICD-10-CM | POA: Diagnosis present

## 2013-01-16 DIAGNOSIS — K839 Disease of biliary tract, unspecified: Secondary | ICD-10-CM

## 2013-01-16 DIAGNOSIS — Z8619 Personal history of other infectious and parasitic diseases: Secondary | ICD-10-CM

## 2013-01-16 DIAGNOSIS — B0229 Other postherpetic nervous system involvement: Secondary | ICD-10-CM

## 2013-01-16 DIAGNOSIS — F17200 Nicotine dependence, unspecified, uncomplicated: Secondary | ICD-10-CM

## 2013-01-16 DIAGNOSIS — I2510 Atherosclerotic heart disease of native coronary artery without angina pectoris: Secondary | ICD-10-CM | POA: Diagnosis present

## 2013-01-16 DIAGNOSIS — G9340 Encephalopathy, unspecified: Secondary | ICD-10-CM

## 2013-01-16 DIAGNOSIS — M2550 Pain in unspecified joint: Secondary | ICD-10-CM

## 2013-01-16 DIAGNOSIS — J209 Acute bronchitis, unspecified: Secondary | ICD-10-CM

## 2013-01-16 DIAGNOSIS — M549 Dorsalgia, unspecified: Secondary | ICD-10-CM

## 2013-01-16 DIAGNOSIS — J40 Bronchitis, not specified as acute or chronic: Secondary | ICD-10-CM

## 2013-01-16 DIAGNOSIS — F329 Major depressive disorder, single episode, unspecified: Secondary | ICD-10-CM

## 2013-01-16 DIAGNOSIS — B029 Zoster without complications: Secondary | ICD-10-CM

## 2013-01-16 DIAGNOSIS — N390 Urinary tract infection, site not specified: Secondary | ICD-10-CM

## 2013-01-16 DIAGNOSIS — R1011 Right upper quadrant pain: Secondary | ICD-10-CM

## 2013-01-16 DIAGNOSIS — H209 Unspecified iridocyclitis: Secondary | ICD-10-CM

## 2013-01-16 DIAGNOSIS — F341 Dysthymic disorder: Secondary | ICD-10-CM

## 2013-01-16 DIAGNOSIS — E785 Hyperlipidemia, unspecified: Secondary | ICD-10-CM

## 2013-01-16 DIAGNOSIS — E538 Deficiency of other specified B group vitamins: Secondary | ICD-10-CM

## 2013-01-16 DIAGNOSIS — R933 Abnormal findings on diagnostic imaging of other parts of digestive tract: Secondary | ICD-10-CM

## 2013-01-16 DIAGNOSIS — G894 Chronic pain syndrome: Secondary | ICD-10-CM

## 2013-01-16 DIAGNOSIS — I872 Venous insufficiency (chronic) (peripheral): Secondary | ICD-10-CM

## 2013-01-16 DIAGNOSIS — G8929 Other chronic pain: Secondary | ICD-10-CM

## 2013-01-16 LAB — COMPREHENSIVE METABOLIC PANEL
ALT: 13 U/L (ref 0–35)
Albumin: 3.7 g/dL (ref 3.5–5.2)
Alkaline Phosphatase: 117 U/L (ref 39–117)
BUN: 11 mg/dL (ref 6–23)
Chloride: 102 mEq/L (ref 96–112)
Potassium: 4.6 mEq/L (ref 3.5–5.1)
Sodium: 140 mEq/L (ref 135–145)
Total Bilirubin: 0.4 mg/dL (ref 0.3–1.2)
Total Protein: 6.9 g/dL (ref 6.0–8.3)

## 2013-01-16 LAB — CBC WITH DIFFERENTIAL/PLATELET
Basophils Relative: 0 % (ref 0–1)
Eosinophils Absolute: 0.4 10*3/uL (ref 0.0–0.7)
Hemoglobin: 13.4 g/dL (ref 12.0–15.0)
Lymphs Abs: 3.6 10*3/uL (ref 0.7–4.0)
MCHC: 32.8 g/dL (ref 30.0–36.0)
Monocytes Relative: 11 % (ref 3–12)
Neutro Abs: 4.2 10*3/uL (ref 1.7–7.7)
Neutrophils Relative %: 46 % (ref 43–77)
Platelets: 236 10*3/uL (ref 150–400)
RBC: 4.39 MIL/uL (ref 3.87–5.11)

## 2013-01-16 LAB — TROPONIN I: Troponin I: <0.3 ng/mL (ref <0.30)

## 2013-01-16 LAB — CG4 I-STAT (LACTIC ACID): Lactic Acid, Venous: 1.57 mmol/L (ref 0.5–2.2)

## 2013-01-16 MED ORDER — NALOXONE HCL 1 MG/ML IJ SOLN
1.0000 mg/h | INTRAMUSCULAR | Status: DC
  Administered 2013-01-17 (×2): 1 mg/h via INTRAVENOUS
  Filled 2013-01-16 (×4): qty 4

## 2013-01-16 MED ORDER — NALOXONE HCL 1 MG/ML IJ SOLN
2.0000 mg | INTRAMUSCULAR | Status: DC | PRN
  Filled 2013-01-16: qty 2

## 2013-01-16 MED ORDER — NALOXONE HCL 1 MG/ML IJ SOLN
1.0000 mg | Freq: Once | INTRAMUSCULAR | Status: AC
  Administered 2013-01-16: 1 mg via INTRAVENOUS
  Filled 2013-01-16: qty 2

## 2013-01-16 MED ORDER — NALOXONE HCL 1 MG/ML IJ SOLN
2.0000 mg | Freq: Once | INTRAMUSCULAR | Status: AC
Start: 2013-01-16 — End: 2013-01-16
  Administered 2013-01-16: 2 mg via INTRAVENOUS
  Filled 2013-01-16: qty 2

## 2013-01-16 MED ORDER — NALOXONE HCL 0.4 MG/ML IJ SOLN
INTRAMUSCULAR | Status: AC
  Filled 2013-01-16: qty 5

## 2013-01-16 NOTE — ED Notes (Signed)
Pharmacy tech counted medications and found there to be 79 pills in the Ativan bottle when there was 93 and it was filled yesterday. Also 79 pills noted to be found in the Oxycodone bottle which was filled today and had 93 in it originally. Pts daughter reports that her mother has a problem with abusing narcotics.

## 2013-01-16 NOTE — ED Notes (Addendum)
Pt was noted to be found slumped in a wheelchair upstairs. Pt has multiple bottles of Oxycodone and Ativan with greater than the recommened dose of pills missing from the bottles. Pt responsive only to noxious stimuli. Pt maintaining her airway and her O2 saturations are 100% on 2 L. Pt given 2 mg of narcan and pt is now moving all extremities and moaning. 12 lead shows NSR.

## 2013-01-16 NOTE — ED Provider Notes (Signed)
History    CSN: [id number] Arrival date & time 01/16/13  2147  First MD Initiated Contact with Patient 01/16/13 2200     Chief Complaint  Patient presents with  . Altered Mental Status   (Consider location/radiation/quality/duration/timing/severity/associated sxs/prior Treatment) HPI Comments: Patient brought to the emergency department from upstairs in the hospital. She was apparently waiting for her son who is having surgery and was found slumped over in the hallway. At arrival to the ER, she was found to have multiple medications in her purse, including Ativan and Percocet. Both of these medications were filled within the last 24 hours and at least 12 Percocet tablets are missing and more than 20 Ativan 2 mg tablets are missing. Patient is unable to answer any questions at arrival.  Patient is a 62 y.o. female presenting with altered mental status.  Altered Mental Status  Past Medical History  Diagnosis Date  . Iritis   . History of bronchitis   . GERD (gastroesophageal reflux disease)   . Diverticulosis of colon   . IBS (irritable bowel syndrome)   . Unspecified disorder of biliary tract   . UTI (lower urinary tract infection)   . Interstitial cystitis   . Lumbar back pain   . Fibromyalgia   . Headache   . Dysthymia   . History of shingles   . CAD (coronary artery disease)     Moderate nonobstructive by cath 06/2012   Past Surgical History  Procedure Laterality Date  . Appendectomy    . Hysterectomy and right oopherectomy    . Cholecystectomy    . Common bile duct stent by ercp w/sphincterotomy    . Tonsillectomy    . Tubal ligation    . Cspine surgery  05/2010    by Dr. Gerlene Fee   No family history on file. History  Substance Use Topics  . Smoking status: Current Every Day Smoker -- 0.50 packs/day    Types: Cigarettes  . Smokeless tobacco: Never Used     Comment: 2 packs per week  . Alcohol Use: No     Comment: rare use   OB History   Grav Para Term  Preterm Abortions TAB SAB Ect Mult Living                 Review of Systems  Unable to perform ROS: Mental status change  Psychiatric/Behavioral: Positive for altered mental status.    Allergies  Metoclopramide hcl; Prochlorperazine edisylate; and Varenicline tartrate  Home Medications   Current Outpatient Rx  Name  Route  Sig  Dispense  Refill  . amitriptyline (ELAVIL) 25 MG tablet      Take 3 tablets by mouth at bedtime   90 tablet   3   . ciprofloxacin (CIPRO) 250 MG tablet   Oral   Take 250 mg by mouth 2 (two) times daily. Filled 6.30.14 for 7 days         . dimenhyDRINATE (DRAMAMINE) 50 MG tablet   Oral   Take 50 mg by mouth every 8 (eight) hours as needed.         . gabapentin (NEURONTIN) 300 MG capsule   Oral   Take 300 mg by mouth 3 (three) times daily.         [name] LORazepam (ATIVAN) 2 MG tablet   Oral   Take 2 mg by mouth 3 (three) times daily as needed for anxiety.         . naproxen sodium (ALEVE) 220 MG  tablet   Oral   Take 1-2 tablets (220-440 mg total) by mouth daily as needed (for neck pain). Take with food.           You can try this medicine for your neck pain, but  ...   . omeprazole (PRILOSEC) 40 MG capsule   Oral   Take 40 mg by mouth daily.         [name] oxyCODONE (ROXICODONE) 5 MG immediate release tablet   Oral   Take 1 tablet (5 mg total) by mouth 3 (three) times daily as needed for pain.   90 tablet   0   . potassium chloride (K-DUR) 10 MEQ tablet   Oral   Take 1 tablet (10 mEq total) by mouth daily. For 2 weeks   14 tablet   3   . traMADol (ULTRAM) 50 MG tablet   Oral   Take 100 mg by mouth 3 (three) times daily.         [name] aspirin EC 81 MG EC tablet   Oral   Take 1 tablet (81 mg total) by mouth daily.         . cyclobenzaprine (FLEXERIL) 5 MG tablet   Oral   Take 1 tablet (5 mg total) by mouth 3 (three) times daily as needed (muscle spasms/neck pain).   21 tablet   0   . Multiple Vitamins-Minerals (WOMENS  MULTIVITAMIN PLUS) TABS   Oral   Take 1 tablet by mouth daily.           . pravastatin (PRAVACHOL) 20 MG tablet   Oral   Take 1 tablet (20 mg total) by mouth every evening.   90 tablet   3   . vitamin B-12 (CYANOCOBALAMIN) 1000 MCG tablet   Oral   Take 1,000 mcg by mouth daily.          BP 88/49  Pulse 81  Resp 19  SpO2 99% Physical Exam  Constitutional: She appears well-developed and well-nourished. She appears lethargic. No distress.  HENT:  Head: Normocephalic and atraumatic.  Right Ear: Hearing normal.  Left Ear: Hearing normal.  Nose: Nose normal.  Mouth/Throat: Oropharynx is clear and moist and mucous membranes are normal.  Eyes: Conjunctivae and EOM are normal. Pupils are equal, round, and reactive to light.  Neck: Normal range of motion. Neck supple.  Cardiovascular: Regular rhythm, S1 normal and S2 normal.  Exam reveals no gallop and no friction rub.   No murmur heard. Pulmonary/Chest: Effort normal and breath sounds normal. No respiratory distress. She exhibits no tenderness.  Abdominal: Soft. Normal appearance and bowel sounds are normal. There is no hepatosplenomegaly. There is no tenderness. There is no rebound, no guarding, no tenderness at McBurney's point and negative Murphy's sign. No hernia.  Musculoskeletal: Normal range of motion.  Neurological: She has normal strength. She appears lethargic. No cranial nerve deficit or sensory deficit. Coordination normal. GCS eye subscore is 2. GCS verbal subscore is 4. GCS motor subscore is 5.  sleeping  Skin: Skin is warm, dry and intact. No rash noted. No cyanosis.  Psychiatric: She has a normal mood and affect. Her speech is normal and behavior is normal. Thought content normal.    ED Course  Procedures (including critical care time) Labs Reviewed  COMPREHENSIVE METABOLIC PANEL - Abnormal; Notable for the following:    Glucose, Bld 116 (*)    All other components within normal limits  SALICYLATE LEVEL -  Abnormal; Notable for the following:  Salicylate Lvl 0.5 (*)    All other components within normal limits  CBC WITH DIFFERENTIAL  TROPONIN I  ETHANOL  ACETAMINOPHEN LEVEL  URINALYSIS, ROUTINE W REFLEX MICROSCOPIC  URINE RAPID DRUG SCREEN (HOSP PERFORMED)  CG4 I-STAT (LACTIC ACID)   Dg Chest Port 1 View  01/16/2013   *RADIOLOGY REPORT*  Clinical Data: Patient unresponsive.  PORTABLE CHEST - 1 VIEW  Comparison: Plain film chest 06/19/2012.  Findings: Lungs are clear.  Heart size is normal.  No pneumothorax or pleural fluid.  IMPRESSION: Negative chest.   Original Report Authenticated By: Holley Dexter, M.D.   Diagnosis: Mental status changes, likely overdose  MDM  History lives in the ER lethargic. She was brought down from the waiting area for surgery where her son was having surgery. She appear to have overdosed based on the multiple medications missing from her bottles that were filled yesterday and today. Her daughter-in-law has come into the ER and confirms that she has had overdoses in the past and problems with pain medications.  Patient was administered Narcan and did wake up and was able to follow commands. She did start to become sleepy again and required repeat dosing was placed on a Narcan drip. She is responding last given Narcan now, and this is likely because she has a significant Ativan component to her overdose.critical care consult as it is felt the patient may have progressive worsening and might require intubation.  Gilda Crease, MD 01/17/13 [phone]

## 2013-01-17 ENCOUNTER — Encounter (HOSPITAL_COMMUNITY): Payer: Self-pay | Admitting: Critical Care Medicine

## 2013-01-17 ENCOUNTER — Inpatient Hospital Stay (HOSPITAL_COMMUNITY): Payer: Medicare Other

## 2013-01-17 DIAGNOSIS — T50901A Poisoning by unspecified drugs, medicaments and biological substances, accidental (unintentional), initial encounter: Secondary | ICD-10-CM

## 2013-01-17 DIAGNOSIS — R4182 Altered mental status, unspecified: Secondary | ICD-10-CM | POA: Diagnosis present

## 2013-01-17 DIAGNOSIS — G9340 Encephalopathy, unspecified: Secondary | ICD-10-CM

## 2013-01-17 LAB — BASIC METABOLIC PANEL
BUN: 8 mg/dL (ref 6–23)
CO2: 29 mEq/L (ref 19–32)
Chloride: 109 mEq/L (ref 96–112)
Creatinine, Ser: 0.54 mg/dL (ref 0.50–1.10)
Glucose, Bld: 129 mg/dL — ABNORMAL HIGH (ref 70–99)
Potassium: 4 mEq/L (ref 3.5–5.1)

## 2013-01-17 LAB — URINALYSIS, ROUTINE W REFLEX MICROSCOPIC
Bilirubin Urine: NEGATIVE
Nitrite: NEGATIVE
Specific Gravity, Urine: 1.009 (ref 1.005–1.030)
Urobilinogen, UA: 0.2 mg/dL (ref 0.0–1.0)
pH: 5.5 (ref 5.0–8.0)

## 2013-01-17 LAB — RAPID URINE DRUG SCREEN, HOSP PERFORMED
Amphetamines: NOT DETECTED
Benzodiazepines: NOT DETECTED
Benzodiazepines: NOT DETECTED
Cocaine: NOT DETECTED
Cocaine: NOT DETECTED
Opiates: NOT DETECTED
Opiates: NOT DETECTED
Tetrahydrocannabinol: NOT DETECTED

## 2013-01-17 LAB — CBC
HCT: 39.2 % (ref 36.0–46.0)
Hemoglobin: 12.8 g/dL (ref 12.0–15.0)
MCV: 92.9 fL (ref 78.0–100.0)
WBC: 6.1 10*3/uL (ref 4.0–10.5)

## 2013-01-17 LAB — SALICYLATE LEVEL: Salicylate Lvl: <2 mg/dL — ABNORMAL LOW (ref 2.8–20.0)

## 2013-01-17 LAB — URINE MICROSCOPIC-ADD ON

## 2013-01-17 MED ORDER — HEPARIN SODIUM (PORCINE) 5000 UNIT/ML IJ SOLN
5000.0000 [IU] | Freq: Three times a day (TID) | INTRAMUSCULAR | Status: DC
  Administered 2013-01-17 – 2013-01-18 (×5): 5000 [IU] via SUBCUTANEOUS
  Filled 2013-01-17 (×7): qty 1

## 2013-01-17 MED ORDER — PANTOPRAZOLE SODIUM 40 MG PO TBEC
40.0000 mg | DELAYED_RELEASE_TABLET | Freq: Every day | ORAL | Status: DC
  Administered 2013-01-17 – 2013-01-18 (×2): 40 mg via ORAL
  Filled 2013-01-17 (×2): qty 1

## 2013-01-17 MED ORDER — NALOXONE HCL 0.4 MG/ML IJ SOLN
INTRAMUSCULAR | Status: AC
  Administered 2013-01-17: 2 mg
  Filled 2013-01-17: qty 5

## 2013-01-17 MED ORDER — PANTOPRAZOLE SODIUM 40 MG IV SOLR
40.0000 mg | Freq: Every day | INTRAVENOUS | Status: DC
  Filled 2013-01-17: qty 40

## 2013-01-17 MED ORDER — SODIUM CHLORIDE 0.9 % IV SOLN: 250.0000 mL | INTRAVENOUS | Status: DC | PRN

## 2013-01-17 MED ORDER — SODIUM CHLORIDE 0.9 % IV SOLN
INTRAVENOUS | Status: DC
  Administered 2013-01-17: 03:00:00 via INTRAVENOUS

## 2013-01-17 NOTE — Progress Notes (Signed)
eLink Physician-Brief Progress Note Patient Name: Diane Williams DOB: [birth year] MRN: [id number]  Date of Service  01/17/2013   HPI/Events of Note   Pt with overdose of percocet and ativan.  Pt to be admitted to icu for obs on narcan drip  eICU Interventions  See orders for admission    Intervention Category Major Interventions: Change in mental status - evaluation and management  Shan Levans 01/17/2013, 1:32 AM
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
## 2013-01-17 NOTE — Progress Notes (Signed)
UR COMPLETED  

## 2013-01-17 NOTE — Progress Notes (Signed)
VASCULAR LAB PRELIMINARY  PRELIMINARY  PRELIMINARY  PRELIMINARY  Carotid Dopplers completed.    Preliminary report:  There is 0-39% ICA stenosis.  Vertebral artery flow is antegrade.  Rayhana Slider, RVT 01/17/2013, 4:16 PM

## 2013-01-17 NOTE — H&P (Signed)
PULMONARY  / CRITICAL CARE MEDICINE  Name: Diane Williams MRN: [id number] DOB: 1951/03/18    ADMISSION DATE:  01/16/2013 CONSULTATION DATE:  01/17/2013  REFERRING MD :  EDP PRIMARY SERVICE:  PCCM  CHIEF COMPLAINT:  Altered mental status  BRIEF PATIENT DESCRIPTION: 62 yo brought [facility] ED unresponsive with multiple bottles of Oxycodone and Ativan found on her.  In ED responded to multiple doses of Narcan, placed on Narcan gtt.  PCCM was consulted.  SIGNIFICANT EVENTS / STUDIES:   LINES / TUBES:  CULTURES:  ANTIBIOTICS:  SUBJECTIVE: Feeling well, unclear of what happened. No complaints.  VITAL SIGNS: Temp:  [97.8 F (36.6 C)-98.7 F (37.1 C)] 97.8 F (36.6 C) (07/02 0811) Pulse Rate:  [66-101] 76 (07/02 0700) Resp:  [16-22] 17 (07/02 0700) BP: (87-129)/(45-77) 117/77 mmHg (07/02 0700) SpO2:  [89 %-100 %] 98 % (07/02 0700) Weight:  [154 lb 12.2 oz (70.2 kg)] 154 lb 12.2 oz (70.2 kg) (07/02 0300)  HEMODYNAMICS:   VENTILATOR SETTINGS:   INTAKE / OUTPUT: Intake/Output     07/01 0701 - 07/02 0700 07/02 0701 - 07/03 0700   I.V. (mL/kg) 406.7 (5.8)    Total Intake(mL/kg) 406.7 (5.8)    Urine (mL/kg/hr) 350    Total Output 350     Net +56.7           PHYSICAL EXAMINATION: General:  Well appearing female, in NAD Neuro:  AOx3, follows commands, muscle strength.  Cardiovascular:  RRR, no m/r/g Lungs:  CTAB. No rales, rhonchi, wheezing. Abdomen:  Soft, nontender, nondistended. Musculoskeletal:  Moves all extremities, no edema Skin:  Intact  LABS:  Recent Labs Lab 01/16/13 2233 01/16/13 2237 01/17/13 0430  HGB 13.4  --  12.8  WBC 9.2  --  6.1  PLT 236  --  221  NA 140  --  143  K 4.6  --  4.0  CL 102  --  109  CO2 32  --  29  GLUCOSE 116*  --  129*  BUN 11  --  8  CREATININE 0.72  --  0.54  CALCIUM 9.1  --  8.6  AST 24  --   --   ALT 13  --   --   ALKPHOS 117  --   --   BILITOT 0.4  --   --   PROT 6.9  --   --   ALBUMIN 3.7  --   --   LATICACIDVEN  --  1.57   --   TROPONINI <0.30  --   --     Recent Labs Lab 01/17/13 0308  GLUCAP 111*    CXR:  7/1 >>> No acute disease/findings.  ASSESSMENT / PLAN:  PULMONARY A:  Patient AOx3 and protecting airway. Good O2 sats on RA P:   Supplemental oxygen if needed.  CARDIOVASCULAR A: Hemodynamically stable.  No arrhythmia / ischemia. Hx of CAD P: Will restart home ASA and Pravachol today. -keep tele until complete understanding of neuro status change  RENAL A:  No active issues. P:   Repeat BMP in am  KVO fluids.  GASTROINTESTINAL A:  No active issues. P:   NPO currently.  Patient okay to start diet Protonix for GI PPx  HEMATOLOGIC A:  No active issues. P:  Heparin for DVT Px  INFECTIOUS A:  No active issues P:   No intervention required  ENDOCRINE  A:  No active issues. P:   No intervention required  NEUROLOGIC A:  Acute encephalopathy.  ?  Benzodiazepine / opioids overdose, urine negative x 2 for narcs, etiology?? Still unclear to me R/o conversion d/o  (psych induced) stress from sons surgery, r/o tox OD from neurontin, amytr other agents  Ensure no TIA etc Not a narc WD look on narcan at all either, does not support narcs as agent P:   Narcan gtt - can D/C today and observe Repeat UDS as it did not reveal Opiates or Benzos (patient admits to daily usage) Hold Elavil, Neurontin, Ativan, Oxycodone, Flexeril Take better history of other non urine tox pick up agents as cause Consider head CT, carotids, echo, keep tele asa psych evaluation (declines any suicidal ideation or action) TSH  TODAY'S SUMMARY:  Discontinue Narcan drip, can likely go to the floor later today. To triad  Ccm time 35 min  I have fully examined this patient and agree with above findings.    And edite din full   Mcarthur Rossetti. Tyson Alias, MD, FACP Pgr: [phone] [facility]

## 2013-01-17 NOTE — H&P (Signed)
PULMONARY  / CRITICAL CARE MEDICINE  Name: Diane Williams MRN: [id number] DOB: [birth year]    ADMISSION DATE:  01/16/2013 CONSULTATION DATE:  01/17/2013  REFERRING MD :  EDP PRIMARY SERVICE:  PCCM  CHIEF COMPLAINT:  Altered mental status  BRIEF PATIENT DESCRIPTION: 62 yo brought [facility] ED unresponsive with multiple bottles of Oxycodone and Ativan found on her.  In ED responded to multiple doses of Narcan, placed on Narcan gtt.  PCCM was consulted.  SIGNIFICANT EVENTS / STUDIES:   LINES / TUBES:  CULTURES:  ANTIBIOTICS:  The patient is encephalopathic and unable to provide history, which was obtained for available medical records.  HISTORY OF PRESENT ILLNESS:  62 yo brought [facility] ED unresponsive with multiple bottles of Oxycodone and Ativan found on her.  In ED responded to multiple doses of Narcan, placed on Narcan gtt.  PCCM was consulted.  PAST MEDICAL HISTORY :  Past Medical History  Diagnosis Date  . Iritis   . History of bronchitis   . GERD (gastroesophageal reflux disease)   . Diverticulosis of colon   . IBS (irritable bowel syndrome)   . Unspecified disorder of biliary tract   . UTI (lower urinary tract infection)   . Interstitial cystitis   . Lumbar back pain   . Fibromyalgia   . Headache(784.0)   . Dysthymia   . History of shingles   . CAD (coronary artery disease)     Moderate nonobstructive by cath 06/2012   Past Surgical History  Procedure Laterality Date  . Appendectomy    . Hysterectomy and right oopherectomy    . Cholecystectomy    . Common bile duct stent by ercp w/sphincterotomy    . Tonsillectomy    . Tubal ligation    . Cspine surgery  05/2010    by Dr. Gerlene Fee   Prior to Admission medications   Medication Sig Start Date End Date Taking? Authorizing Provider  amitriptyline (ELAVIL) 25 MG tablet Take 3 tablets by mouth at bedtime 06/05/12 06/05/13 Yes Michele Mcalpine, MD  ciprofloxacin (CIPRO) 250 MG tablet Take 250 mg by mouth 2 (two) times daily. Filled  6.30.14 for 7 days   Yes Historical Provider, MD  dimenhyDRINATE (DRAMAMINE) 50 MG tablet Take 50 mg by mouth every 8 (eight) hours as needed.   Yes Historical Provider, MD  gabapentin (NEURONTIN) 300 MG capsule Take 300 mg by mouth 3 (three) times daily.   Yes Historical Provider, MD  LORazepam (ATIVAN) 2 MG tablet Take 2 mg by mouth 3 (three) times daily as needed for anxiety.   Yes Historical Provider, MD  naproxen sodium (ALEVE) 220 MG tablet Take 1-2 tablets (220-440 mg total) by mouth daily as needed (for neck pain). Take with food. 06/21/12  Yes Dayna N Dunn, PA-C  omeprazole (PRILOSEC) 40 MG capsule Take 40 mg by mouth daily.   Yes Historical Provider, MD  oxyCODONE (ROXICODONE) 5 MG immediate release tablet Take 1 tablet (5 mg total) by mouth 3 (three) times daily as needed for pain. 07/04/12 07/04/13 Yes Michele Mcalpine, MD  potassium chloride (K-DUR) 10 MEQ tablet Take 1 tablet (10 mEq total) by mouth daily. For 2 weeks 11/21/12  Yes Herby Abraham, MD  traMADol (ULTRAM) 50 MG tablet Take 100 mg by mouth 3 (three) times daily.   Yes Historical Provider, MD  aspirin EC 81 MG EC tablet Take 1 tablet (81 mg total) by mouth daily. 06/21/12   Dayna N Dunn, PA-C  cyclobenzaprine (FLEXERIL) 5 MG  tablet Take 1 tablet (5 mg total) by mouth 3 (three) times daily as needed (muscle spasms/neck pain). 06/21/12   Dayna N Dunn, PA-C  Multiple Vitamins-Minerals (WOMENS MULTIVITAMIN PLUS) TABS Take 1 tablet by mouth daily.      Historical Provider, MD  pravastatin (PRAVACHOL) 20 MG tablet Take 1 tablet (20 mg total) by mouth every evening. 07/03/12   Beatrice Lecher, PA-C  vitamin B-12 (CYANOCOBALAMIN) 1000 MCG tablet Take 1,000 mcg by mouth daily.    Historical Provider, MD   Allergies  Allergen Reactions  . Metoclopramide Hcl     REACTION: tremors  . Prochlorperazine Edisylate     REACTION: tremors  . Varenicline Tartrate     REACTION: nausea    FAMILY HISTORY:  History reviewed. No pertinent family  history.  SOCIAL HISTORY:  reports that she has been smoking Cigarettes.  She has been smoking about 0.50 packs per day. She has never used smokeless tobacco. She reports that she does not drink alcohol or use illicit drugs.  REVIEW OF SYSTEMS:  Unable to provide.  INTERVAL HISTORY:  VITAL SIGNS: Pulse Rate:  [73-101] 75 (07/02 0030) Resp:  [16-21] 18 (07/02 0030) BP: (87-118)/(45-70) 118/70 mmHg (07/02 0030) SpO2:  [89 %-100 %] 98 % (07/02 0030)  HEMODYNAMICS:   VENTILATOR SETTINGS:   INTAKE / OUTPUT: Intake/Output   None    PHYSICAL EXAMINATION: General:  Appears in no distress Neuro:  Obtunded but wakes up to stimulation, protects airway HEENT:  Pinpoint pupils Cardiovascular:  RRR, no m/r/g Lungs:  Bilateral diminished air entry, no w/r/r Abdomen:  Soft, nontender, bowel sounds diminished Musculoskeletal:  Moves all extremities, no edema Skin:  Intact  LABS:  Recent Labs Lab 01/16/13 2233 01/16/13 2237  HGB 13.4  --   WBC 9.2  --   PLT 236  --   NA 140  --   K 4.6  --   CL 102  --   CO2 32  --   GLUCOSE 116*  --   BUN 11  --   CREATININE 0.72  --   CALCIUM 9.1  --   AST 24  --   ALT 13  --   ALKPHOS 117  --   BILITOT 0.4  --   PROT 6.9  --   ALBUMIN 3.7  --   LATICACIDVEN  --  1.57  TROPONINI <0.30  --    No results found for this basename: GLUCAP,  in the last 168 hours  CXR:  7/1 >>> nad  ASSESSMENT / PLAN:  PULMONARY A:  Protects airway for now.  At risk for acute respiratory failure. P:   Gaol SpO2>92 Supplemental oxygen PRN Monitor for ability to protect airway  CARDIOVASCULAR A: Hemodynamically stable.  No arrhythmia / ischemia. P:  Cardiac monitoring Preadmission ASA, Pravachol when able  RENAL A:  No active issues. P:   Trend BMP NS@100   GASTROINTESTINAL A:  No active issues. P:   NPO Protonix for GI Px  HEMATOLOGIC A:  No active issues. P:  Trend CBC Heparin for DVT Px  INFECTIOUS A:  No active issues P:    No intervention required  ENDOCRINE  A:  No active issues. P:   No intervention required  NEUROLOGIC A:  Acute encephalopathy.  Benzodiazepine / opioids overdose. P:   Narcan gtt End Tidal CO2 monitoring Benzo reversal is not indicated - marginal benefit / risk of seizures Hold Elavil, Neurontin, Ativan, Oxycodone, Flexeril  TODAY'S SUMMARY: Opioids / benzo  overdose - Narcan gtt.  Monitor for airway patency / ETCO2.  Hold sedatives.  I have personally obtained a history, examined the patient, evaluated laboratory and imaging results, formulated the assessment and plan and placed orders.  CRITICAL CARE:  The patient is critically ill with multiple organ systems failure and requires high complexity decision making for assessment and support, frequent evaluation and titration of therapies, application of advanced monitoring technologies and extensive interpretation of multiple databases. Critical Care Time devoted to patient care services described in this note is 45 minutes.   Lonia Farber, MD Pulmonary and Critical Care Medicine [facility] Pager: [phone]  01/17/2013, 2:44 AM

## 2013-01-18 ENCOUNTER — Inpatient Hospital Stay (HOSPITAL_COMMUNITY): Payer: Medicare Other

## 2013-01-18 DIAGNOSIS — R4182 Altered mental status, unspecified: Secondary | ICD-10-CM

## 2013-01-18 DIAGNOSIS — F39 Unspecified mood [affective] disorder: Secondary | ICD-10-CM

## 2013-01-18 DIAGNOSIS — G9340 Encephalopathy, unspecified: Principal | ICD-10-CM

## 2013-01-18 DIAGNOSIS — F411 Generalized anxiety disorder: Secondary | ICD-10-CM

## 2013-01-18 DIAGNOSIS — G894 Chronic pain syndrome: Secondary | ICD-10-CM | POA: Diagnosis present

## 2013-01-18 LAB — LIPID PANEL
LDL Cholesterol: 60 mg/dL (ref 0–99)
Triglycerides: 40 mg/dL (ref <150)
VLDL: 8 mg/dL (ref 0–40)

## 2013-01-18 MED ORDER — VITAMIN B-12 1000 MCG PO TABS
1000.0000 ug | ORAL_TABLET | Freq: Every day | ORAL | Status: DC
  Administered 2013-01-18: 1000 ug via ORAL
  Filled 2013-01-18: qty 1

## 2013-01-18 MED ORDER — NAPROXEN SODIUM 550 MG PO TABS
550.0000 mg | ORAL_TABLET | Freq: Every day | ORAL | Status: DC | PRN
  Filled 2013-01-18: qty 1

## 2013-01-18 MED ORDER — NAPROXEN 500 MG PO TABS
500.0000 mg | ORAL_TABLET | Freq: Every day | ORAL | Status: DC | PRN
  Administered 2013-01-18: 500 mg via ORAL
  Filled 2013-01-18: qty 1

## 2013-01-18 MED ORDER — ASPIRIN EC 81 MG PO TBEC
81.0000 mg | DELAYED_RELEASE_TABLET | Freq: Every day | ORAL | Status: DC
  Administered 2013-01-18: 81 mg via ORAL
  Filled 2013-01-18: qty 1

## 2013-01-18 MED ORDER — SIMVASTATIN 10 MG PO TABS
10.0000 mg | ORAL_TABLET | Freq: Every day | ORAL | Status: DC
  Filled 2013-01-18: qty 1

## 2013-01-18 NOTE — Procedures (Signed)
ELECTROENCEPHALOGRAM REPORT   Patient: Diane Williams       Room #: 1610 Age: 62 y.o.        Sex: female Referring Physician: Rai Report Date:  01/18/2013        Interpreting Physician: Thana Farr D  History: Diane Williams is an 62 y.o. female with altered mental status  Medications:  Scheduled: . aspirin EC  81 mg Oral Daily  . heparin  5,000 Units Subcutaneous Q8H  . pantoprazole  40 mg Oral Daily  . simvastatin  10 mg Oral q1800  . vitamin B-12  1,000 mcg Oral Daily    Conditions of Recording:  This is a 16 channel EEG carried out with the patient in the awake, drowsy and asleep states.  Description:  The waking background activity consists of a low voltage, symmetrical, fairly well organized, 9 Hz alpha activity, seen from the parieto-occipital and posterior temporal regions.  Low voltage fast activity, poorly organized, is seen anteriorly and is at times superimposed on more posterior regions.  A mixture of theta and alpha rhythms are seen from the central and temporal regions. The patient drowses with slowing to irregular, low voltage theta and beta activity.   The patient goes in to a light sleep with symmetrical sleep spindles, vertex central sharp transients and irregular slow activity.   Hyperventilation was not performed.  Intermittent photic stimulation was performed but failed to illicit any change in the tracing.   IMPRESSION: This is a normal EEG   Thana Farr, MD [facility] [phone] 01/18/2013, 5:18 PM

## 2013-01-18 NOTE — Progress Notes (Signed)
Patient ID: Diane Williams  female  ZOX:[id number]    DOB: 05-18-1951    DOA: 01/16/2013  PCP: Michele Mcalpine, MD  Assessment/Plan: Principal Problem:  Acute encephalopathy:  There was a question of Polysubstance overdose due to multiple bottles of oxycodone and Ativan found in her bag and pills were missing. Patient improved with narcan in ED and Narcan drip in ICU, but UDS was negative for any toxic agents. Patient was sitting in waiting for her son's surgery yesterday. - Transferred out of the ICU by [facility] M. troponin less than 0.3, lactic acid 1.5, no acute electrolyte abnormalities - Continue to hold sedatives; Elavil, Neurontin, Ativan, Oxycodone, Flexeril - TIA was in consideration, will obtain MRI brain. Carotid Dopplers were done and showed 0-39% ICA stenosis - 2-D echo pending, PT eval - Continue aspirin 81 mg daily and statin - EEG to r/o any seizures - Called psychiatry to evaluate patient, patient denies suicide attempt or ideation to me   Active Problems:   Altered mental status- now resolved    Chronic pain syndrome - Currently stable, continue to hold sedatives  DVT Prophylaxis:  Code Status:  Disposition:    Subjective: Currently stable, oriented, denies any suicidal ideation  Objective: Weight change: -2.705 kg (-5 lb 15.4 oz)  Intake/Output Summary (Last 24 hours) at 01/18/13 0924 Last data filed at 01/18/13 0849  Gross per 24 hour  Intake 445.67 ml  Output   1965 ml  Net -1519.33 ml   Blood pressure 105/65, pulse 83, temperature 99.1 F (37.3 C), temperature source Oral, resp. rate 17, height 5\' 6"  (1.676 m), weight 67.495 kg (148 lb 12.8 oz), SpO2 99.00%.  Physical Exam: General: Alert and awake, oriented x3, not in any acute distress. CVS: S1-S2 clear, no murmur rubs or gallops Chest: clear to auscultation bilaterally, no wheezing, rales or rhonchi Abdomen: soft nontender, nondistended, normal bowel sounds  Extremities: no cyanosis, clubbing or edema  noted bilaterally Neuro: Cranial nerves II-XII intact, no focal neurological deficits  Lab Results: Basic Metabolic Panel:  Recent Labs Lab 01/16/13 2233 01/17/13 0430  NA 140 143  K 4.6 4.0  CL 102 109  CO2 32 29  GLUCOSE 116* 129*  BUN 11 8  CREATININE 0.72 0.54  CALCIUM 9.1 8.6   Liver Function Tests:  Recent Labs Lab 01/16/13 2233  AST 24  ALT 13  ALKPHOS 117  BILITOT 0.4  PROT 6.9  ALBUMIN 3.7   No results found for this basename: LIPASE, AMYLASE,  in the last 168 hours No results found for this basename: AMMONIA,  in the last 168 hours CBC:  Recent Labs Lab 01/16/13 2233 01/17/13 0430  WBC 9.2 6.1  NEUTROABS 4.2  --   HGB 13.4 12.8  HCT 40.8 39.2  MCV 92.9 92.9  PLT 236 221   Cardiac Enzymes:  Recent Labs Lab 01/16/13 2233  TROPONINI <0.30   BNP: No components found with this basename: POCBNP,  CBG:  Recent Labs Lab 01/17/13 0308  GLUCAP 111*     Micro Results: Recent Results (from the past 240 hour(s))  MRSA PCR SCREENING     Status: None   Collection Time    01/17/13  4:51 AM      Result Value Range Status   MRSA by PCR NEGATIVE  NEGATIVE Final   Comment:            The GeneXpert MRSA Assay (FDA     approved for NASAL specimens     only), is  one component of a     comprehensive MRSA colonization     surveillance program. It is not     intended to diagnose MRSA     infection nor to guide or     monitor treatment for     MRSA infections.    Studies/Results: Ct Head Wo Contrast  01/17/2013   *RADIOLOGY REPORT*  Clinical Data: Altered mental status  CT HEAD WITHOUT CONTRAST  Technique:  Contiguous axial images were obtained from the base of the skull through the vertex without contrast.  Comparison: None.  Findings: Minimal global atrophy.  Brain parenchyma, ventricles system are within normal limits.  No mass effect, midline shift, or acute intracranial hemorrhage.  Tiny sub centimeter calcified meningioma in the right lateral  frontal lobe region is present. Cranium is intact.  Mastoid air cells and visualized paranasal sinuses are clear.  IMPRESSION: No acute intracranial pathology.  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   Original Report Authenticated By: Jolaine Click, M.D.   Dg Chest Port 1 View  01/16/2013   *RADIOLOGY REPORT*  Clinical Data: Patient unresponsive.  PORTABLE CHEST - 1 VIEW  Comparison: Plain film chest 06/19/2012.  Findings: Lungs are clear.  Heart size is normal.  No pneumothorax or pleural fluid.  IMPRESSION: Negative chest.   Original Report Authenticated By: Holley Dexter, M.D.    Medications: Scheduled Meds: . aspirin EC  81 mg Oral Daily  . heparin  5,000 Units Subcutaneous Q8H  . pantoprazole  40 mg Oral Daily      LOS: 2 days   Serapio Edelson M.D. [facility] 01/18/2013, 9:24 AM Pager: [phone]  If 7PM-7AM, please contact night-coverage [email or web address] Password [email or web address]

## 2013-01-18 NOTE — Progress Notes (Signed)
Spoke with pt regarding importance of waiting for test results prior to discharging. Explained to pt that medications will be returned to her on d/c, but that the physician does NOT endorse resuming medications prior to MD follow-up. Pt agreeable at this time to waiting for EEG results, however, pt stating she would really like to leave tonight. At this time pt calm/cooperative with plan of care. Will continue to monitor.

## 2013-01-18 NOTE — Consult Note (Signed)
Reason for Consult: Overdose of benzo and opioids and questionable suicide Referring Physician: Dr. Venia Minks is an 62 y.o. female.  HPI: Patient is seen and chart reviewed. Patient son, daughter-in-law and grandson has been at bedside. Patient stated she wants to go home with her family and denies symptoms of depression anxiety psychosis. Patient denied season onset ideation intents or plans. Patient reported she came to visit her son who had surgery on Tuesday in his hand. She does not know what happened when she was slumped over in.heart rate while waiting. Patient reported she has been taking her medication as prescribed and she adamantly denied overdosing on medications. Interestingly patient [facility] drug screen did not show either benzodiazepines or opiates which she claims taking consistently for the last 2 days. Patient also has a chronic pain syndromes and has been taking pain medication for long time patient son and her daughter-in-law has both legal problems and drug of Abuse by history. Reportedly patient son was incarcerated for 16 months and she could not see her grandson who has been staying with her daughter-in-law. Patient is a happy to see her grandson is in her room.   Mental Status Examination: Patient appeared as per his stated age, casually dressed, and fairly groomed, and maintaining good eye contact. Patient has good mood and his affect was constricted. He has normal rate, rhythm, and volume of speech. His thought process is linear and goal directed. Patient has denied suicidal, homicidal ideations, intentions or plans. Patient has no evidence of auditory or visual hallucinations, delusions, and paranoia. Patient has fair insight judgment and impulse control.  Past Medical History  Diagnosis Date  . Iritis   . History of bronchitis   . GERD (gastroesophageal reflux disease)   . Diverticulosis of colon   . IBS (irritable bowel syndrome)   . Unspecified disorder of biliary  tract   . UTI (lower urinary tract infection)   . Interstitial cystitis   . Lumbar back pain   . Fibromyalgia   . Headache(784.0)   . Dysthymia   . History of shingles   . CAD (coronary artery disease)     Moderate nonobstructive by cath 06/2012    Past Surgical History  Procedure Laterality Date  . Appendectomy    . Hysterectomy and right oopherectomy    . Cholecystectomy    . Common bile duct stent by ercp w/sphincterotomy    . Tonsillectomy    . Tubal ligation    . Cspine surgery  05/2010    by Dr. Gerlene Fee    History reviewed. No pertinent family history.  Social History:  reports that she has been smoking Cigarettes.  She has been smoking about 0.50 packs per day. She has never used smokeless tobacco. She reports that she does not drink alcohol or use illicit drugs.  Allergies:  Allergies  Allergen Reactions  . Keflex (Cephalexin)     "Body Sores"  . Metoclopramide Hcl     REACTION: tremors  . Prochlorperazine Edisylate     REACTION: tremors  . Varenicline Tartrate     REACTION: nausea    Medications: I have reviewed the patient's current medications.  Results for orders placed during the hospital encounter of 01/16/13 (from the past 48 hour(s))  CBC WITH DIFFERENTIAL     Status: None   Collection Time    01/16/13 10:33 PM      Result Value Range   WBC 9.2  4.0 - 10.5 K/uL   RBC 4.39  3.87 - 5.11 MIL/uL   Hemoglobin 13.4  12.0 - 15.0 g/dL   HCT 40.9  81.1 - 91.4 %   MCV 92.9  78.0 - 100.0 fL   MCH 30.5  26.0 - 34.0 pg   MCHC 32.8  30.0 - 36.0 g/dL   RDW 78.2  95.6 - 21.3 %   Platelets 236  150 - 400 K/uL   Neutrophils Relative % 46  43 - 77 %   Neutro Abs 4.2  1.7 - 7.7 K/uL   Lymphocytes Relative 39  12 - 46 %   Lymphs Abs 3.6  0.7 - 4.0 K/uL   Monocytes Relative 11  3 - 12 %   Monocytes Absolute 1.0  0.1 - 1.0 K/uL   Eosinophils Relative 4  0 - 5 %   Eosinophils Absolute 0.4  0.0 - 0.7 K/uL   Basophils Relative 0  0 - 1 %   Basophils Absolute  0.0  0.0 - 0.1 K/uL  COMPREHENSIVE METABOLIC PANEL     Status: Abnormal   Collection Time    01/16/13 10:33 PM      Result Value Range   Sodium 140  135 - 145 mEq/L   Potassium 4.6  3.5 - 5.1 mEq/L   Chloride 102  96 - 112 mEq/L   CO2 32  19 - 32 mEq/L   Glucose, Bld 116 (*) 70 - 99 mg/dL   BUN 11  6 - 23 mg/dL   Creatinine, Ser 0.86  0.50 - 1.10 mg/dL   Calcium 9.1  8.4 - 57.8 mg/dL   Total Protein 6.9  6.0 - 8.3 g/dL   Albumin 3.7  3.5 - 5.2 g/dL   AST 24  0 - 37 U/L   ALT 13  0 - 35 U/L   Alkaline Phosphatase 117  39 - 117 U/L   Total Bilirubin 0.4  0.3 - 1.2 mg/dL   GFR calc non Af Amer >90  >90 mL/min   GFR calc Af Amer >90  >90 mL/min   Comment:            The eGFR has been calculated     using the CKD EPI equation.     This calculation has not been     validated in all clinical     situations.     eGFR's persistently     <90 mL/min signify     possible Chronic Kidney Disease.  TROPONIN I     Status: None   Collection Time    01/16/13 10:33 PM      Result Value Range   Troponin I <0.30  <0.30 ng/mL   Comment:            Due to the release kinetics of cTnI,     a negative result within the first hours     of the onset of symptoms does not rule out     myocardial infarction with certainty.     If myocardial infarction is still suspected,     repeat the test at appropriate intervals.  ETHANOL     Status: None   Collection Time    01/16/13 10:33 PM      Result Value Range   Alcohol, Ethyl (B) <11  0 - 11 mg/dL   Comment:            LOWEST DETECTABLE LIMIT FOR     SERUM ALCOHOL IS 11 mg/dL     FOR MEDICAL PURPOSES ONLY  ACETAMINOPHEN LEVEL     Status: None   Collection Time    01/16/13 10:33 PM      Result Value Range   Acetaminophen (Tylenol), Serum <15.0  10 - 30 ug/mL   Comment:            THERAPEUTIC CONCENTRATIONS VARY     SIGNIFICANTLY. A RANGE OF 10-30     ug/mL MAY BE AN EFFECTIVE     CONCENTRATION FOR MANY PATIENTS.     HOWEVER, SOME ARE BEST  TREATED     AT CONCENTRATIONS OUTSIDE THIS     RANGE.     ACETAMINOPHEN CONCENTRATIONS     >150 ug/mL AT 4 HOURS AFTER     INGESTION AND >50 ug/mL AT 12     HOURS AFTER INGESTION ARE     OFTEN ASSOCIATED WITH TOXIC     REACTIONS.  SALICYLATE LEVEL     Status: Abnormal   Collection Time    01/16/13 10:33 PM      Result Value Range   Salicylate Lvl <2.0 (*) 2.8 - 20.0 mg/dL   Comment: CORRECTED RESULTS CALLED TO:     KELLY HUNT,RN AT 1332 01/17/13 BY ZPERRY.     CORRECTED ON 07/02 AT 1332: PREVIOUSLY REPORTED AS 0.5  CG4 I-STAT (LACTIC ACID)     Status: None   Collection Time    01/16/13 10:37 PM      Result Value Range   Lactic Acid, Venous 1.57  0.5 - 2.2 mmol/L  URINALYSIS, ROUTINE W REFLEX MICROSCOPIC     Status: Abnormal   Collection Time    01/16/13 11:52 PM      Result Value Range   Color, Urine YELLOW  YELLOW   APPearance CLEAR  CLEAR   Specific Gravity, Urine 1.009  1.005 - 1.030   pH 5.5  5.0 - 8.0   Glucose, UA NEGATIVE  NEGATIVE mg/dL   Hgb urine dipstick NEGATIVE  NEGATIVE   Bilirubin Urine NEGATIVE  NEGATIVE   Ketones, ur NEGATIVE  NEGATIVE mg/dL   Protein, ur NEGATIVE  NEGATIVE mg/dL   Urobilinogen, UA 0.2  0.0 - 1.0 mg/dL   Nitrite NEGATIVE  NEGATIVE   Leukocytes, UA TRACE (*) NEGATIVE  URINE RAPID DRUG SCREEN (HOSP PERFORMED)     Status: None   Collection Time    01/16/13 11:52 PM      Result Value Range   Opiates NONE DETECTED  NONE DETECTED   Cocaine NONE DETECTED  NONE DETECTED   Benzodiazepines NONE DETECTED  NONE DETECTED   Amphetamines NONE DETECTED  NONE DETECTED   Tetrahydrocannabinol NONE DETECTED  NONE DETECTED   Barbiturates NONE DETECTED  NONE DETECTED   Comment:            DRUG SCREEN FOR MEDICAL PURPOSES     ONLY.  IF CONFIRMATION IS NEEDED     FOR ANY PURPOSE, NOTIFY LAB     WITHIN 5 DAYS.                LOWEST DETECTABLE LIMITS     FOR URINE DRUG SCREEN     Drug Class       Cutoff (ng/mL)     Amphetamine      1000     Barbiturate       200     Benzodiazepine   200     Tricyclics       300     Opiates  300     Cocaine          300     THC              50  URINE MICROSCOPIC-ADD ON     Status: None   Collection Time    01/16/13 11:52 PM      Result Value Range   Squamous Epithelial / LPF RARE  RARE   WBC, UA 3-6  <3 WBC/hpf   RBC / HPF 0-2  <3 RBC/hpf  GLUCOSE, CAPILLARY     Status: Abnormal   Collection Time    01/17/13  3:08 AM      Result Value Range   Glucose-Capillary 111 (*) 70 - 99 mg/dL  CBC     Status: None   Collection Time    01/17/13  4:30 AM      Result Value Range   WBC 6.1  4.0 - 10.5 K/uL   RBC 4.22  3.87 - 5.11 MIL/uL   Hemoglobin 12.8  12.0 - 15.0 g/dL   HCT 16.1  09.6 - 04.5 %   MCV 92.9  78.0 - 100.0 fL   MCH 30.3  26.0 - 34.0 pg   MCHC 32.7  30.0 - 36.0 g/dL   RDW 40.9  81.1 - 91.4 %   Platelets 221  150 - 400 K/uL  BASIC METABOLIC PANEL     Status: Abnormal   Collection Time    01/17/13  4:30 AM      Result Value Range   Sodium 143  135 - 145 mEq/L   Potassium 4.0  3.5 - 5.1 mEq/L   Chloride 109  96 - 112 mEq/L   CO2 29  19 - 32 mEq/L   Glucose, Bld 129 (*) 70 - 99 mg/dL   BUN 8  6 - 23 mg/dL   Creatinine, Ser 7.82  0.50 - 1.10 mg/dL   Calcium 8.6  8.4 - 95.6 mg/dL   GFR calc non Af Amer >90  >90 mL/min   GFR calc Af Amer >90  >90 mL/min   Comment:            The eGFR has been calculated     using the CKD EPI equation.     This calculation has not been     validated in all clinical     situations.     eGFR's persistently     <90 mL/min signify     possible Chronic Kidney Disease.  LIPID PANEL     Status: None   Collection Time    01/17/13  4:30 AM      Result Value Range   Cholesterol 126  0 - 200 mg/dL   Triglycerides 40  <213 mg/dL   HDL 58  >08 mg/dL   Total CHOL/HDL Ratio 2.2     VLDL 8  0 - 40 mg/dL   LDL Cholesterol 60  0 - 99 mg/dL   Comment:            Total Cholesterol/HDL:CHD Risk     Coronary Heart Disease Risk Table                          Men   Women      1/2 Average Risk   3.4   3.3      Average Risk       5.0   4.4      2 X  Average Risk   9.6   7.1      3 X Average Risk  23.4   11.0                Use the calculated Patient Ratio     above and the CHD Risk Table     to determine the patient's CHD Risk.                ATP III CLASSIFICATION (LDL):      <100     mg/dL   Optimal      161-096  mg/dL   Near or Above                        Optimal      130-159  mg/dL   Borderline      045-409  mg/dL   High      >811     mg/dL   Very High  MRSA PCR SCREENING     Status: None   Collection Time    01/17/13  4:51 AM      Result Value Range   MRSA by PCR NEGATIVE  NEGATIVE   Comment:            The GeneXpert MRSA Assay (FDA     approved for NASAL specimens     only), is one component of a     comprehensive MRSA colonization     surveillance program. It is not     intended to diagnose MRSA     infection nor to guide or     monitor treatment for     MRSA infections.  URINE RAPID DRUG SCREEN (HOSP PERFORMED)     Status: None   Collection Time    01/17/13  8:47 AM      Result Value Range   Opiates NONE DETECTED  NONE DETECTED   Cocaine NONE DETECTED  NONE DETECTED   Benzodiazepines NONE DETECTED  NONE DETECTED   Amphetamines NONE DETECTED  NONE DETECTED   Tetrahydrocannabinol NONE DETECTED  NONE DETECTED   Barbiturates NONE DETECTED  NONE DETECTED   Comment:            DRUG SCREEN FOR MEDICAL PURPOSES     ONLY.  IF CONFIRMATION IS NEEDED     FOR ANY PURPOSE, NOTIFY LAB     WITHIN 5 DAYS.                LOWEST DETECTABLE LIMITS     FOR URINE DRUG SCREEN     Drug Class       Cutoff (ng/mL)     Amphetamine      1000     Barbiturate      200     Benzodiazepine   200     Tricyclics       300     Opiates          300     Cocaine          300     THC              50  TSH     Status: None   Collection Time    01/18/13  6:35 AM      Result Value Range   TSH 0.415  0.350 - 4.500 uIU/mL    Ct Head Wo  Contrast  01/17/2013   *RADIOLOGY REPORT*  Clinical Data: Altered mental  status  CT HEAD WITHOUT CONTRAST  Technique:  Contiguous axial images were obtained from the base of the skull through the vertex without contrast.  Comparison: None.  Findings: Minimal global atrophy.  Brain parenchyma, ventricles system are within normal limits.  No mass effect, midline shift, or acute intracranial hemorrhage.  Tiny sub centimeter calcified meningioma in the right lateral frontal lobe region is present. Cranium is intact.  Mastoid air cells and visualized paranasal sinuses are clear.  IMPRESSION: No acute intracranial pathology.  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   Original Report Authenticated By: Jolaine Click, M.D.   Mr [facility] Wo Contrast  01/18/2013   *RADIOLOGY REPORT*  Clinical Data:  Unresponsive episode.  Question TIA versus overdose.  Altered mental status now resolved.  MRI HEAD WITHOUT CONTRAST MRA HEAD WITHOUT CONTRAST  Technique:  Multiplanar, multiecho pulse sequences of the brain and surrounding structures were obtained without intravenous contrast. Angiographic images of the head were obtained using MRA technique without contrast.  Comparison:  CT head 01/17/2013  MRI HEAD  Findings:  There is no evidence for acute infarction, intracranial hemorrhage, mass lesion, hydrocephalus, or extra-axial fluid. Slight atrophy.  Mild chronic microvascular ischemic change.  No foci of chronic hemorrhage.  Tiny right frontotemporal osteoma without compression of underlying brain.  No midline shift.  Normal temporal lobes.  Flow voids are maintained in the major intracranial vessels.  No midline abnormality.  No worrisome osseous findings.  Sinuses, mastoids, orbits, and other extracranial soft tissues unremarkable.  IMPRESSION: Mild atrophy.  Mild chronic microvascular ischemic change.  No acute intracranial findings.  MRA HEAD  Findings: The internal carotid arteries are dolichoectatic but widely patent.   Similarly, the basilar artery is dolichoectatic but widely patent.  The left vertebral is dominant.  Right vertebral ends in PICA.  Fetal origin right PCA.  No proximal stenosis of the anterior, middle, or posterior cerebral arteries.  Congenital hypoplasia right A1 ACA.  No MCA or PCA branch occlusion.  No intracranial aneurysm.  IMPRESSION: Unremarkable MRA intracranial circulation.   Original Report Authenticated By: Davonna Belling, M.D.   Mr Brain Wo Contrast  01/18/2013   *RADIOLOGY REPORT*  Clinical Data:  Unresponsive episode.  Question TIA versus overdose.  Altered mental status now resolved.  MRI HEAD WITHOUT CONTRAST MRA HEAD WITHOUT CONTRAST  Technique:  Multiplanar, multiecho pulse sequences of the brain and surrounding structures were obtained without intravenous contrast. Angiographic images of the head were obtained using MRA technique without contrast.  Comparison:  CT head 01/17/2013  MRI HEAD  Findings:  There is no evidence for acute infarction, intracranial hemorrhage, mass lesion, hydrocephalus, or extra-axial fluid. Slight atrophy.  Mild chronic microvascular ischemic change.  No foci of chronic hemorrhage.  Tiny right frontotemporal osteoma without compression of underlying brain.  No midline shift.  Normal temporal lobes.  Flow voids are maintained in the major intracranial vessels.  No midline abnormality.  No worrisome osseous findings.  Sinuses, mastoids, orbits, and other extracranial soft tissues unremarkable.  IMPRESSION: Mild atrophy.  Mild chronic microvascular ischemic change.  No acute intracranial findings.  MRA HEAD  Findings: The internal carotid arteries are dolichoectatic but widely patent.  Similarly, the basilar artery is dolichoectatic but widely patent.  The left vertebral is dominant.  Right vertebral ends in PICA.  Fetal origin right PCA.  No proximal stenosis of the anterior, middle, or posterior cerebral arteries.  Congenital hypoplasia right A1 ACA.  No MCA or PCA  branch occlusion.  No  intracranial aneurysm.  IMPRESSION: Unremarkable MRA intracranial circulation.   Original Report Authenticated By: Davonna Belling, M.D.   Dg Chest Port 1 View  01/16/2013   *RADIOLOGY REPORT*  Clinical Data: Patient unresponsive.  PORTABLE CHEST - 1 VIEW  Comparison: Plain film chest 06/19/2012.  Findings: Lungs are clear.  Heart size is normal.  No pneumothorax or pleural fluid.  IMPRESSION: Negative chest.   Original Report Authenticated By: Holley Dexter, M.D.    Positive for anxiety and bad mood Blood pressure 120/64, pulse 99, temperature 99.1 F (37.3 C), temperature source Oral, resp. rate 18, height 5\' 6"  (1.676 m), weight 67.495 kg (148 lb 12.8 oz), SpO2 99.00%.   Assessment/Plan: Mood disorder not otherwise specified  Recommendation: Case is discussed with her hospitalist Dr. Isidoro Donning  Patient does not meet criteria for acute psychiatric hospitalization, may discharge home with family members when medically cleared Patient will be referred to the outpatient psychiatric services if needed No medication recommended at this time Appreciate psychiatric consultation  Janeisha Ryle,JANARDHAHA R. 01/18/2013, 2:37 PM

## 2013-01-18 NOTE — Progress Notes (Signed)
Pt signed d/c papers, understands recommendation to hold medications. IV removed. Pt home medications returned from pharmacy. Pt unable to locate bra, underwear, and shorts. Will continue to monitor.

## 2013-01-18 NOTE — Progress Notes (Signed)
Routine adult EEG completed. 

## 2013-01-18 NOTE — Progress Notes (Signed)
Pt requesting to discharge. MD Rai text paged via [email or web address]. Will continue to monitor.

## 2013-01-18 NOTE — Progress Notes (Signed)
Echocardiogram 2D Echocardiogram has been performed.  Arvil Chaco 01/18/2013, 12:42 PM

## 2013-01-18 NOTE — Discharge Summary (Signed)
Physician Discharge Summary  Patient ID: Diane Williams MRN: [id number] DOB/AGE: 62-11-1950 62 y.o.  Admit date: 01/16/2013 Discharge date: 01/18/2013  Primary Care Physician:  Diane Peck, MD  Discharge Diagnoses:    . Altered mental status- resolved . Encephalopathy acute . Chronic pain syndrome with polypharmacy  Consults: Psychiatry                   Admitted by critical care service   Recommendations for Outpatient Follow-up:  1. Patient appears to have poly-pharmacy with chronic pain syndrome. She may benefit from pain management clinic. I have placed several of her medications on HOLD until she follows with PCP.   Allergies:   Allergies  Allergen Reactions  . Keflex (Cephalexin)     "Body Sores"  . Metoclopramide Hcl     REACTION: tremors  . Prochlorperazine Edisylate     REACTION: tremors  . Varenicline Tartrate     REACTION: nausea     Discharge Medications:   Medication List    STOP taking these medications       amitriptyline 25 MG tablet  Commonly known as:  ELAVIL     ciprofloxacin 250 MG tablet  Commonly known as:  CIPRO     gabapentin 300 MG capsule  Commonly known as:  NEURONTIN     LORazepam 2 MG tablet  Commonly known as:  ATIVAN     oxyCODONE 5 MG immediate release tablet  Commonly known as:  ROXICODONE     traMADol 50 MG tablet  Commonly known as:  ULTRAM      TAKE these medications       aspirin 81 MG EC tablet  Take 1 tablet (81 mg total) by mouth daily.     naproxen sodium 220 MG tablet  Commonly known as:  ALEVE  Take 1-2 tablets (220-440 mg total) by mouth daily as needed (for neck pain). Take with food.     omeprazole 40 MG capsule  Commonly known as:  PRILOSEC  Take 40 mg by mouth daily.     potassium chloride 10 MEQ tablet  Commonly known as:  K-DUR  Take 10 mEq by mouth daily.     pravastatin 20 MG tablet  Commonly known as:  PRAVACHOL  Take 1 tablet (20 mg total) by mouth every evening.     vitamin  B-12 1000 MCG tablet  Commonly known as:  CYANOCOBALAMIN  Take 1,000 mcg by mouth daily.     Womens Multivitamin Plus Tabs  Take 1 tablet by mouth daily.         Brief H and P: For complete details please refer to admission H and P, but in brief 62 yo brought to [facility] ED unresponsive with multiple bottles of Oxycodone and Ativan found on her. In ED responded to multiple doses of Narcan, placed on Narcan gtt. Patient was admitted by [facility] service to ICU.    Hospital Course:  Acute encephalopathy: Resolved. Patient was admitted by critical care service for possible poly substance overdose. There was a question of Polysubstance overdose due to multiple bottles of oxycodone and Ativan found in her bag and pills were missing. Patient improved with narcan in ED and Narcan drip in ICU. However UDS was negative for any toxic agents, BZD's or opiates twice when claimed that she has been taking her pain medications consistently .Transferred out of the ICU by PCCM on 01/17/13,. troponin remained less than 0.3, lactic acid 1.5, no acute electrolyte abnormalities.  Elavil, Neurontin, Ativan, Oxycodone, Flexeril were placed on hold.  TIA was in consideration, hence MRI/MRA brain.was obtained which was negative for any CVA/infarct. Carotid Dopplers were done and showed 0-39% ICA stenosis.  2-D echo showed EF 60-65% with no regional wall motion abnormalities.  Continue aspirin 81 mg daily and statin   EEG was done and was completely normal.   Psychiatry was consulted to evaluate patient, patient denied suicide attempt or ideation. She was evaluated by Dr Elsie Saas who cleared her to DC home.   She will benefit from Pain management clinic referral.    Altered mental status- now resolved  Chronic pain syndrome  - Currently stable, continue to hold sedatives    Day of Discharge BP 137/69  Pulse 106  Temp(Src) 98.4 F (36.9 C) (Oral)  Resp 18  Ht 5\' 6"  (1.676 m)  Wt 67.495 kg (148 lb 12.8 oz)  BMI  24.03 kg/m2  SpO2 97%  Physical Exam: General: Alert and awake oriented x3 not in any acute distress. HEENT: anicteric sclera, pupils reactive to light and accommodation CVS: S1-S2 clear no murmur rubs or gallops Chest: clear to auscultation bilaterally, no wheezing rales or rhonchi Abdomen: soft nontender, nondistended, normal bowel sounds, no organomegaly Extremities: no cyanosis, clubbing or edema noted bilaterally Neuro: Cranial nerves II-XII intact, no focal neurological deficits   The results of significant diagnostics from this hospitalization (including imaging, microbiology, ancillary and laboratory) are listed below for reference.    LAB RESULTS: Basic Metabolic Panel:  Recent Labs Lab 01/16/13 2233 01/17/13 0430  NA 140 143  K 4.6 4.0  CL 102 109  CO2 32 29  GLUCOSE 116* 129*  BUN 11 8  CREATININE 0.72 0.54  CALCIUM 9.1 8.6   Liver Function Tests:  Recent Labs Lab 01/16/13 2233  AST 24  ALT 13  ALKPHOS 117  BILITOT 0.4  PROT 6.9  ALBUMIN 3.7   No results found for this basename: LIPASE, AMYLASE,  in the last 168 hours No results found for this basename: AMMONIA,  in the last 168 hours CBC:  Recent Labs Lab 01/16/13 2233 01/17/13 0430  WBC 9.2 6.1  NEUTROABS 4.2  --   HGB 13.4 12.8  HCT 40.8 39.2  MCV 92.9 92.9  PLT 236 221   Cardiac Enzymes:  Recent Labs Lab 01/16/13 2233  TROPONINI <0.30   BNP: No components found with this basename: POCBNP,  CBG:  Recent Labs Lab 01/17/13 0308  GLUCAP 111*    Significant Diagnostic Studies:  Ct Head Wo Contrast  01/17/2013   *RADIOLOGY REPORT*  Clinical Data: Altered mental status  CT HEAD WITHOUT CONTRAST  Technique:  Contiguous axial images were obtained from the base of the skull through the vertex without contrast.  Comparison: None.  Findings: Minimal global atrophy.  Brain parenchyma, ventricles system are within normal limits.  No mass effect, midline shift, or acute intracranial  hemorrhage.  Tiny sub centimeter calcified meningioma in the right lateral frontal lobe region is present. Cranium is intact.  Mastoid air cells and visualized paranasal sinuses are clear.  IMPRESSION: No acute intracranial pathology.  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   Original Report Authenticated By: Jolaine Click, M.D.   Dg Chest Port 1 View  01/16/2013   *RADIOLOGY REPORT*  Clinical Data: Patient unresponsive.  PORTABLE CHEST - 1 VIEW  Comparison: Plain film chest 06/19/2012.  Findings: Lungs are clear.  Heart size is normal.  No pneumothorax or pleural fluid.  IMPRESSION: Negative chest.  Original Report Authenticated By: Holley Dexter, M.D.    2D ECHO:   Disposition and Follow-up:     Discharge Orders   Future Orders Complete By Expires     Diet - low sodium heart healthy  As directed     Discharge instructions  As directed     Comments:      Please note that several of your medications are on HOLD. You need to see Dr Su Hilt before starting elavil, oxycodone, neurontin, ativan, tramadol. Your medications may need dose adjustments.    Increase activity slowly  As directed         DISPOSITION: home DIET: regular diet  ACTIVITY: as tolerated   DISCHARGE FOLLOW-UP Follow-up Information   Follow up with ROBERTS, Vernie Ammons, MD. Schedule an appointment as soon as possible for a visit in 1 week. (for hospital follow-up)    Contact information:   [address] [place] [address] [phone]       Time spent on Discharge: 35 mins  Signed:   Ercil Cassis M.D. [facility] 01/18/2013, 5:03 PM Pager: [phone]

## 2013-01-18 NOTE — Progress Notes (Signed)
Pt treansferred to unit. AOx4, amb with assistance. Pt oriented to unit,staff, and safety. Call bell light within reach. Bed alarm on.

## 2013-10-09 ENCOUNTER — Other Ambulatory Visit: Payer: Self-pay | Admitting: Internal Medicine

## 2013-10-09 DIAGNOSIS — R102 Pelvic and perineal pain: Secondary | ICD-10-CM

## 2013-10-09 DIAGNOSIS — R109 Unspecified abdominal pain: Secondary | ICD-10-CM

## 2013-10-10 ENCOUNTER — Ambulatory Visit (HOSPITAL_COMMUNITY)
Admission: RE | Admit: 2013-10-10 | Discharge: 2013-10-10 | Disposition: A | Discharge status: Home / Self Care | Payer: Medicare Other | Source: Ambulatory Visit | Attending: Internal Medicine | Admitting: Internal Medicine

## 2013-10-10 ENCOUNTER — Other Ambulatory Visit: Payer: Self-pay | Admitting: Internal Medicine

## 2013-10-10 DIAGNOSIS — R109 Unspecified abdominal pain: Secondary | ICD-10-CM

## 2013-10-10 DIAGNOSIS — Z9079 Acquired absence of other genital organ(s): Secondary | ICD-10-CM | POA: Insufficient documentation

## 2013-10-10 DIAGNOSIS — Z87440 Personal history of urinary (tract) infections: Secondary | ICD-10-CM | POA: Insufficient documentation

## 2013-10-10 DIAGNOSIS — R102 Pelvic and perineal pain: Secondary | ICD-10-CM

## 2013-10-10 DIAGNOSIS — K838 Other specified diseases of biliary tract: Secondary | ICD-10-CM | POA: Insufficient documentation

## 2013-10-10 DIAGNOSIS — Z90710 Acquired absence of both cervix and uterus: Secondary | ICD-10-CM | POA: Insufficient documentation

## 2013-10-10 DIAGNOSIS — Z9089 Acquired absence of other organs: Secondary | ICD-10-CM | POA: Insufficient documentation

## 2013-10-10 DIAGNOSIS — N949 Unspecified condition associated with female genital organs and menstrual cycle: Secondary | ICD-10-CM | POA: Insufficient documentation

## 2013-10-11 ENCOUNTER — Other Ambulatory Visit: Payer: Self-pay

## 2013-12-04 ENCOUNTER — Encounter: Payer: Self-pay | Admitting: Gastroenterology

## 2014-02-12 ENCOUNTER — Encounter: Payer: Self-pay | Admitting: Gastroenterology

## 2014-02-12 ENCOUNTER — Ambulatory Visit (INDEPENDENT_AMBULATORY_CARE_PROVIDER_SITE_OTHER): Payer: 59 | Admitting: Gastroenterology

## 2014-02-12 ENCOUNTER — Other Ambulatory Visit (INDEPENDENT_AMBULATORY_CARE_PROVIDER_SITE_OTHER): Payer: 59

## 2014-02-12 ENCOUNTER — Ambulatory Visit: Payer: Self-pay | Admitting: Gastroenterology

## 2014-02-12 VITALS — BP 100/68 | HR 76 | Ht 64.25 in | Wt 157.2 lb

## 2014-02-12 DIAGNOSIS — R1084 Generalized abdominal pain: Secondary | ICD-10-CM

## 2014-02-12 DIAGNOSIS — Z86010 Personal history of colonic polyps: Secondary | ICD-10-CM

## 2014-02-12 DIAGNOSIS — R1033 Periumbilical pain: Secondary | ICD-10-CM | POA: Insufficient documentation

## 2014-02-12 LAB — CBC WITH DIFFERENTIAL/PLATELET
BASOS ABS: 0 10*3/uL (ref 0.0–0.1)
Basophils Relative: 0.3 % (ref 0.0–3.0)
EOS ABS: 0.1 10*3/uL (ref 0.0–0.7)
EOS PCT: 1.3 % (ref 0.0–5.0)
HEMATOCRIT: 40.8 % (ref 36.0–46.0)
Hemoglobin: 13.5 g/dL (ref 12.0–15.0)
LYMPHS ABS: 2.2 10*3/uL (ref 0.7–4.0)
Lymphocytes Relative: 28.8 % (ref 12.0–46.0)
MCHC: 33 g/dL (ref 30.0–36.0)
MCV: 92.8 fl (ref 78.0–100.0)
MONO ABS: 0.6 10*3/uL (ref 0.1–1.0)
Monocytes Relative: 7.2 % (ref 3.0–12.0)
NEUTROS PCT: 62.4 % (ref 43.0–77.0)
Neutro Abs: 4.9 10*3/uL (ref 1.4–7.7)
PLATELETS: 257 10*3/uL (ref 150.0–400.0)
RBC: 4.4 Mil/uL (ref 3.87–5.11)
RDW: 14 % (ref 11.5–15.5)
WBC: 7.8 10*3/uL (ref 4.0–10.5)

## 2014-02-12 LAB — HEPATIC FUNCTION PANEL
ALT: 11 U/L (ref 0–35)
AST: 16 U/L (ref 0–37)
Albumin: 3.9 g/dL (ref 3.5–5.2)
Alkaline Phosphatase: 113 U/L (ref 39–117)
BILIRUBIN DIRECT: 0 mg/dL (ref 0.0–0.3)
BILIRUBIN TOTAL: 0.4 mg/dL (ref 0.2–1.2)
Total Protein: 7 g/dL (ref 6.0–8.3)

## 2014-02-12 LAB — AMYLASE: AMYLASE: 50 U/L (ref 27–131)

## 2014-02-12 MED ORDER — HYOSCYAMINE SULFATE ER 0.375 MG PO TBCR: EXTENDED_RELEASE_TABLET | ORAL | Status: DC

## 2014-02-12 NOTE — Assessment & Plan Note (Signed)
Plan followup colonoscopy 

## 2014-02-12 NOTE — Progress Notes (Signed)
_                                                                                                                History of Present Illness:  Diane Williams  Is a 63 year old white female referred for evaluation of abdominal pain.  For approximately 2 months she's been complaining of mild to moderate) local pain.  It is poorly described.  At times a sounds like an internal "itching".  It may be aching.  Pain may radiate around or through to the back.  It is unrelated to eating, bowel movements or positioning.  There are no ameliorating or exacerbating factors.  Patient has history of an adenomatous polyp removed in 2010.  She has undergone ERCP with stent placement in the past for a lesion of the biliary tree and possible small stone fragments.  She status post sphincterotomy.  She claims this pain is unlike pain she has had in the past so stated with biliary tract disease.  She status post cholecystectomy.  She denies change of bowel habits, nausea or heartburn.  She has frequent minimal rectal bleeding that she attributes to hemorrhoids.  She has rectal soreness as well.  Has a history of interstitial cystitis and gross hematuria.  Recent ultrasound demonstrated biliary ductal dilatation the was a little bit less than it had been previously.  Kidneys and ureter were normal.   Past Medical History  Diagnosis Date  . Iritis   . History of bronchitis   . GERD (gastroesophageal reflux disease)   . Diverticulosis of colon   . IBS (irritable bowel syndrome)   . Unspecified disorder of biliary tract   . UTI (lower urinary tract infection)   . Interstitial cystitis   . Lumbar back pain   . Fibromyalgia   . Headache(784.0)   . Dysthymia   . History of shingles   . CAD (coronary artery disease)     Moderate nonobstructive by cath 06/2012  . Anxiety   . Colon polyps   . Depression   . Gallstones   . HTN (hypertension)   . HLD (hyperlipidemia)   . Pneumonia    Past Surgical History    Procedure Laterality Date  . Appendectomy  1973  . Hysterectomy and right oopherectomy  1989  . Cholecystectomy  1973  . Common bile duct stent by ercp w/sphincterotomy    . Tonsillectomy    . Tubal ligation    . Cspine surgery  05/2010    by Dr. Hal Neer   family history includes Colon cancer in her paternal uncle; Heart disease in her father. Current Outpatient Prescriptions  Medication Sig Dispense Refill  . amitriptyline (ELAVIL) 25 MG tablet Take 75 mg by mouth at bedtime.      [name] aspirin EC 81 MG EC tablet Take 1 tablet (81 mg total) by mouth daily.      [name] dicyclomine (BENTYL) 20 MG tablet Take 20 mg by mouth 3 (three) times daily before meals.      [name]  gabapentin (NEURONTIN) 300 MG capsule Take 300 mg by mouth 3 (three) times daily.      [name] LORazepam (ATIVAN) 2 MG tablet Take 2 mg by mouth 3 (three) times daily.      . Multiple Vitamins-Minerals (WOMENS MULTIVITAMIN PLUS) TABS Take 1 tablet by mouth daily.        . naproxen sodium (ALEVE) 220 MG tablet Take 1-2 tablets (220-440 mg total) by mouth daily as needed (for neck pain). Take with food.      [name] omeprazole (PRILOSEC) 20 MG capsule Take 20 mg by mouth 2 (two) times daily before a meal.      . oxyCODONE-acetaminophen (PERCOCET) 10-325 MG per tablet Take 1 tablet by mouth 3 (three) times daily.      [name] tiZANidine (ZANAFLEX) 4 MG tablet Take 4 mg by mouth 2 (two) times daily.      . traMADol (ULTRAM) 50 MG tablet Take 50 mg by mouth 3 (three) times daily.      . vitamin B-12 (CYANOCOBALAMIN) 1000 MCG tablet Take 1,000 mcg by mouth daily.       No current facility-administered medications for this visit.   Allergies as of 02/12/2014 - Review Complete 02/12/2014  Allergen Reaction Noted  . Compazine [prochlorperazine edisylate]  02/12/2014  . Keflex [cephalexin]  01/18/2013  . Metoclopramide hcl    . Prochlorperazine edisylate  03/17/2009  . Reglan [metoclopramide]  02/12/2014  . Varenicline tartrate      reports that she has  been smoking Cigarettes.  She has been smoking about 0.50 packs per day. She has never used smokeless tobacco. She reports that she does not drink alcohol or use illicit drugs.   Review of Systems: Pertinent positive and negative review of systems were noted in the above HPI section. All other review of systems were otherwise negative.  Vital signs were reviewed in today's medical record Physical Exam: General: Well developed , well nourished, no acute distress Skin: anicteric Head: Normocephalic and atraumatic Eyes:  sclerae anicteric, EOMI Ears: Normal auditory acuity Mouth: No deformity or lesions Neck: Supple, no masses or thyromegaly Lungs: Clear throughout to auscultation Heart: Regular rate and rhythm; no murmurs, rubs or bruits Abdomen: Soft,  and non distended. No masses, hepatosplenomegaly or hernias noted. Normal Bowel sounds.  There is mild tenderness in the right periumbilical area without guarding or rebound Rectal:deferred Musculoskeletal: Symmetrical with no gross deformities  Skin: No lesions on visible extremities Pulses:  Normal pulses noted Extremities: No clubbing, cyanosis, edema or deformities noted Neurological: Alert oriented x 4, grossly nonfocal Cervical Nodes:  No significant cervical adenopathy Inguinal Nodes: No significant inguinal adenopathy Psychological:  Alert and cooperative. Normal mood and affect  See Assessment and Plan under Problem List

## 2014-02-12 NOTE — Assessment & Plan Note (Addendum)
Patient's symptoms are very nonspecific.  I think it is less likely that this is due to biliary tract disease.  It is of low intensity.  Ultrasound did not demonstrate any specific pathology.  Recommendations #1 trial of hyomax 0.375 mg twice a day for 5 days then as needed #2 check LFTs, amylase #3 patient is overdue for colonoscopy

## 2014-02-12 NOTE — Patient Instructions (Addendum)
You have been given a separate informational sheet regarding your tobacco use, the importance of quitting and local resources to help you quit.   You have been scheduled for a colonoscopy. Please follow written instructions given to you at your visit today.  Please pick up your prep kit at the pharmacy within the next 1-3 days. If you use inhalers (even only as needed), please bring them with you on the day of your procedure. Your physician has requested that you go to www.startemmi.com and enter the access code given to you at your visit today. This web site gives a general overview about your procedure. However, you should still follow specific instructions given to you by our office regarding your preparation for the procedure.  Suprep sample given

## 2014-02-26 ENCOUNTER — Telehealth: Payer: Self-pay | Admitting: Gastroenterology

## 2014-02-26 NOTE — Telephone Encounter (Signed)
Left message for the patient to notify the pharmacy when she needs a refill

## 2014-04-22 ENCOUNTER — Encounter: Payer: Self-pay | Admitting: Gastroenterology

## 2014-04-22 ENCOUNTER — Ambulatory Visit (AMBULATORY_SURGERY_CENTER): Payer: 59 | Admitting: Gastroenterology

## 2014-04-22 VITALS — BP 122/66 | HR 75 | Temp 98.2°F | Resp 15 | Ht 64.0 in | Wt 157.0 lb

## 2014-04-22 DIAGNOSIS — K5730 Diverticulosis of large intestine without perforation or abscess without bleeding: Secondary | ICD-10-CM | POA: Diagnosis not present

## 2014-04-22 DIAGNOSIS — D124 Benign neoplasm of descending colon: Secondary | ICD-10-CM | POA: Diagnosis not present

## 2014-04-22 DIAGNOSIS — Z1211 Encounter for screening for malignant neoplasm of colon: Secondary | ICD-10-CM | POA: Diagnosis not present

## 2014-04-22 DIAGNOSIS — R109 Unspecified abdominal pain: Secondary | ICD-10-CM

## 2014-04-22 MED ORDER — SODIUM CHLORIDE 0.9 % IV SOLN: 500.0000 mL | INTRAVENOUS | Status: DC

## 2014-04-22 MED ORDER — METHSCOPOLAMINE BROMIDE 2.5 MG PO TABS: ORAL_TABLET | ORAL | Status: DC

## 2014-04-22 NOTE — Progress Notes (Signed)
Called to room to assist during endoscopic procedure.  Patient ID and intended procedure confirmed with present staff. Received instructions for my participation in the procedure from the performing physician.  

## 2014-04-22 NOTE — Op Note (Signed)
[place]  [address] [place], [id number]   COLONOSCOPY PROCEDURE REPORT  PATIENT: [name], [name]  MR#: #[id number] BIRTHDATE: [birth year] , 71  yrs. old GENDER: female ENDOSCOPIST: Inda Castle, MD REFERRED WF:UXNA Parke Simmers, M.D. PROCEDURE DATE:  04/22/2014 PROCEDURE:   Colonoscopy with snare polypectomy First Screening Colonoscopy - Avg.  risk and is 50 yrs.  old or older - No.  Prior Negative Screening - Now for repeat screening. 10 or more years since last screening  History of Adenoma - Now for follow-up colonoscopy & has been > or = to 3 yrs.  N/A  Polyps Removed Today? Yes. ASA CLASS:   Class II INDICATIONS:average risk for colon cancer. MEDICATIONS: Monitored anesthesia care and Propofol 270 mg IV  DESCRIPTION OF PROCEDURE:   After the risks benefits and alternatives of the procedure were thoroughly explained, informed consent was obtained.  The digital rectal exam revealed no abnormalities of the rectum.   The     endoscope was introduced through the anus and advanced to the cecum, which was identified by both the appendix and ileocecal valve. No adverse events experienced.   The quality of the prep was Suprep good  The instrument was then slowly withdrawn as the colon was fully examined.      COLON FINDINGS: A sessile polyp measuring 4 mm in size was found in the descending colon and sigmoid colon.  A polypectomy was performed with a cold snare.  The resection was complete, the polyp tissue was completely retrieved and sent to histology.   There was mild diverticulosis noted.   The examination was otherwise normal. Retroflexed views revealed no abnormalities. The time to cecum=3 minutes 23 seconds.  Withdrawal time=7 minutes 49 seconds.  The scope was withdrawn and the procedure completed. COMPLICATIONS: There were no immediate complications.  ENDOSCOPIC IMPRESSION: 1.   Sessile polyp measuring 4 mm in size was found in the descending  colon and sigmoid colon; polypectomy was performed with a cold snare 2.   Mild diverticulosis was noted 3.   The examination was otherwise normal  RECOMMENDATIONS: If the polyp(s) removed today are proven to be adenomatous (pre-cancerous) polyps, you will need a repeat colonoscopy in 5 years.  Otherwise you should continue to follow colorectal cancer screening guidelines for "routine risk" patients with colonoscopy in 10 years.  You will receive a letter within 1-2 weeks with the results of your biopsy as well as final recommendations.  Please call my office if you have not received a letter after 3 weeks.  eSigned:  Inda Castle, MD 04/22/2014 11:13 AM   cc:   PATIENT NAME:  Diane Williams, Diane Williams MR#: #[id number]

## 2014-04-22 NOTE — Progress Notes (Signed)
Report to PACU, RN, vss, BBS= Clear.  

## 2014-04-22 NOTE — Patient Instructions (Signed)
Colon polyp x 1 removed and diverticulosis seen today, handouts given. Resume current medications.  YOU HAD AN ENDOSCOPIC PROCEDURE TODAY AT [facility]: Refer to the procedure report that was given to you for any specific questions about what was found during the examination.  If the procedure report does not answer your questions, please call your gastroenterologist to clarify.  If you requested that your care partner not be given the details of your procedure findings, then the procedure report has been included in a sealed envelope for you to review at your convenience later.  YOU SHOULD EXPECT: Some feelings of bloating in the abdomen. Passage of more gas than usual.  Walking can help get rid of the air that was put into your GI tract during the procedure and reduce the bloating. If you had a lower endoscopy (such as a colonoscopy or flexible sigmoidoscopy) you may notice spotting of blood in your stool or on the toilet paper. If you underwent a bowel prep for your procedure, then you may not have a normal bowel movement for a few days.  DIET: Your first meal following the procedure should be a light meal and then it is ok to progress to your normal diet.  A half-sandwich or bowl of soup is an example of a good first meal.  Heavy or fried foods are harder to digest and may make you feel nauseous or bloated.  Likewise meals heavy in dairy and vegetables can cause extra gas to form and this can also increase the bloating.  Drink plenty of fluids but you should avoid alcoholic beverages for 24 hours.  ACTIVITY: Your care partner should take you home directly after the procedure.  You should plan to take it easy, moving slowly for the rest of the day.  You can resume normal activity the day after the procedure however you should NOT DRIVE or use heavy machinery for 24 hours (because of the sedation medicines used during the test).    SYMPTOMS TO REPORT IMMEDIATELY: A gastroenterologist  can be reached at any hour.  During normal business hours, 8:30 AM to 5:00 PM Monday through Friday, call [phone].  After hours and on weekends, please call the GI answering service at [phone] who will take a message and have the physician on call contact you.   Following lower endoscopy (colonoscopy or flexible sigmoidoscopy):  Excessive amounts of blood in the stool  Significant tenderness or worsening of abdominal pains  Swelling of the abdomen that is new, acute  Fever of 100F or higher  Following upper endoscopy (EGD)  Vomiting of blood or coffee ground material  New chest pain or pain under the shoulder blades  Painful or persistently difficult swallowing  New shortness of breath  Fever of 100F or higher  Black, tarry-looking stools  FOLLOW UP: If any biopsies were taken you will be contacted by phone or by letter within the next 1-3 weeks.  Call your gastroenterologist if you have not heard about the biopsies in 3 weeks.  Our staff will call the home number listed on your records the next business day following your procedure to check on you and address any questions or concerns that you may have at that time regarding the information given to you following your procedure. This is a courtesy call and so if there is no answer at the home number and we have not heard from you through the emergency physician on call, we will assume that you  have returned to your regular daily activities without incident.  SIGNATURES/CONFIDENTIALITY: You and/or your care partner have signed paperwork which will be entered into your electronic medical record.  These signatures attest to the fact that that the information above on your After Visit Summary has been reviewed and is understood.  Full responsibility of the confidentiality of this discharge information lies with you and/or your care-partner.

## 2014-04-23 ENCOUNTER — Telehealth: Payer: Self-pay

## 2014-04-23 NOTE — Telephone Encounter (Signed)
No answer, left vm 

## 2014-04-29 ENCOUNTER — Encounter: Payer: Self-pay | Admitting: Gastroenterology

## 2014-06-25 ENCOUNTER — Telehealth: Payer: Self-pay | Admitting: Gastroenterology

## 2014-06-25 NOTE — Telephone Encounter (Signed)
Patient reports rectal bleeding. States this is not a new thing. She does have some pain and itching in perineal area. Again she states this is not a new thing. No fever, nausea and vomiting. She does not have any transportation until Monday. Appointment made for Monday at 2 PM

## 2014-06-27 ENCOUNTER — Encounter (HOSPITAL_COMMUNITY): Payer: Self-pay | Admitting: Cardiology

## 2014-07-01 ENCOUNTER — Ambulatory Visit: Payer: Self-pay | Admitting: Gastroenterology

## 2014-08-07 DIAGNOSIS — R072 Precordial pain: Secondary | ICD-10-CM | POA: Diagnosis not present

## 2014-08-19 ENCOUNTER — Ambulatory Visit
Admission: RE | Admit: 2014-08-19 | Discharge: 2014-08-19 | Disposition: A | Discharge status: Home / Self Care | Payer: Medicare Other | Source: Ambulatory Visit | Attending: Internal Medicine | Admitting: Internal Medicine

## 2014-08-19 ENCOUNTER — Other Ambulatory Visit: Payer: Self-pay | Admitting: Internal Medicine

## 2014-08-19 DIAGNOSIS — R059 Cough, unspecified: Secondary | ICD-10-CM

## 2014-08-19 DIAGNOSIS — Z1389 Encounter for screening for other disorder: Secondary | ICD-10-CM | POA: Diagnosis not present

## 2014-08-19 DIAGNOSIS — R05 Cough: Secondary | ICD-10-CM

## 2014-08-19 DIAGNOSIS — R5383 Other fatigue: Secondary | ICD-10-CM | POA: Diagnosis not present

## 2014-08-19 DIAGNOSIS — I70219 Atherosclerosis of native arteries of extremities with intermittent claudication, unspecified extremity: Secondary | ICD-10-CM | POA: Diagnosis not present

## 2014-08-19 DIAGNOSIS — H9113 Presbycusis, bilateral: Secondary | ICD-10-CM | POA: Diagnosis not present

## 2014-08-19 DIAGNOSIS — Z79899 Other long term (current) drug therapy: Secondary | ICD-10-CM | POA: Diagnosis not present

## 2014-08-19 DIAGNOSIS — Z1211 Encounter for screening for malignant neoplasm of colon: Secondary | ICD-10-CM | POA: Diagnosis not present

## 2014-08-19 DIAGNOSIS — E780 Pure hypercholesterolemia: Secondary | ICD-10-CM | POA: Diagnosis not present

## 2014-08-19 DIAGNOSIS — R42 Dizziness and giddiness: Secondary | ICD-10-CM | POA: Diagnosis not present

## 2014-08-19 DIAGNOSIS — I10 Essential (primary) hypertension: Secondary | ICD-10-CM | POA: Diagnosis not present

## 2014-08-19 DIAGNOSIS — Z0000 Encounter for general adult medical examination without abnormal findings: Secondary | ICD-10-CM | POA: Diagnosis not present

## 2014-08-19 DIAGNOSIS — Z131 Encounter for screening for diabetes mellitus: Secondary | ICD-10-CM | POA: Diagnosis not present

## 2014-08-19 DIAGNOSIS — I2510 Atherosclerotic heart disease of native coronary artery without angina pectoris: Secondary | ICD-10-CM | POA: Diagnosis not present

## 2014-08-19 DIAGNOSIS — R1032 Left lower quadrant pain: Secondary | ICD-10-CM | POA: Diagnosis not present

## 2014-08-19 DIAGNOSIS — R002 Palpitations: Secondary | ICD-10-CM | POA: Diagnosis not present

## 2014-08-19 DIAGNOSIS — Z87891 Personal history of nicotine dependence: Secondary | ICD-10-CM | POA: Diagnosis not present

## 2014-08-19 DIAGNOSIS — H8309 Labyrinthitis, unspecified ear: Secondary | ICD-10-CM | POA: Diagnosis not present

## 2014-09-16 DIAGNOSIS — I10 Essential (primary) hypertension: Secondary | ICD-10-CM | POA: Diagnosis not present

## 2014-10-14 DIAGNOSIS — R300 Dysuria: Secondary | ICD-10-CM | POA: Diagnosis not present

## 2014-11-10 NOTE — Op Note (Signed)
PATIENT NAME:  Diane Williams, Diane Williams MR#:  [id number] DATE OF BIRTH:  06-09-1951  DATE OF PROCEDURE:  12/07/2011  PREOPERATIVE DIAGNOSIS: Visually significant cataract of the left eye.   POSTOPERATIVE DIAGNOSIS: Visually significant cataract of the left eye.   OPERATIVE PROCEDURE: Cataract extraction by phacoemulsification with implant of intraocular lens to left eye.   SURGEON: Birder Robson, MD.   ANESTHESIA:  1. Managed anesthesia care.  2. Topical tetracaine drops followed by 2% Xylocaine jelly applied in the preoperative holding area.   COMPLICATIONS: None.   TECHNIQUE:  Stop-and-chop    DESCRIPTION OF PROCEDURE: The patient was examined and consented in the preoperative holding area where the aforementioned topical anesthesia was applied to the left eye and then brought back to the Operating Room where the left eye was prepped and draped in the usual sterile ophthalmic fashion and a lid speculum was placed. A paracentesis was created with the side port blade and the anterior chamber was filled with viscoelastic. A near clear corneal incision was performed with the steel keratome. A continuous curvilinear capsulorrhexis was performed with a cystotome followed by the capsulorrhexis forceps. Hydrodissection and hydrodelineation were carried out with BSS on a blunt cannula. The lens was removed in a stop-and-chop technique and the remaining cortical material was removed with the irrigation-aspiration handpiece. The capsular bag was inflated with viscoelastic and the Technus ZCB00 20.0-diopter lens, serial number [id number] was placed in the capsular bag without complication. The remaining viscoelastic was removed from the eye with the irrigation-aspiration handpiece. The wounds were hydrated. The anterior chamber was flushed with Miostat and the eye was inflated to physiologic pressure. The wounds were found to be water tight. The eye was dressed with Vigamox. The patient was given protective  glasses to wear throughout the day and a shield with which to sleep tonight. The patient was also given drops with which to begin a drop regimen today and will follow-up with me in one day.   ____________________________ Livingston Diones. Norris Brumbach, MD wlp:drc D: 12/07/2011 11:50:42 ET T: 12/07/2011 12:29:06 ET JOB#: [id number]  cc: Williette Loewe L. Bronte Sabado, MD, <Dictator> Livingston Diones Sumiye Hirth MD ELECTRONICALLY SIGNED 12/08/2011 13:38

## 2014-11-11 DIAGNOSIS — R300 Dysuria: Secondary | ICD-10-CM | POA: Diagnosis not present

## 2014-12-25 ENCOUNTER — Ambulatory Visit: Payer: Self-pay | Admitting: Cardiology

## 2015-01-02 ENCOUNTER — Ambulatory Visit: Payer: Self-pay | Admitting: Cardiology

## 2015-01-06 ENCOUNTER — Encounter: Payer: Self-pay | Admitting: Cardiology

## 2015-03-10 ENCOUNTER — Ambulatory Visit: Payer: Self-pay | Admitting: Cardiology

## 2015-03-11 NOTE — Progress Notes (Signed)
Cardiology Office Note   Date:  03/12/2015   ID:  Diane Williams, DOB [birth year], MRN [id number]  PCP:  Myriam Jacobson, MD    Chief Complaint  Patient presents with  . New Evaluation    neck, right legs and feet are hurting and swelling      History of Present Illness: Diane Williams is a 64 y.o. female who presents for evaluation of LE edema.  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  SHe occasionally will have some pressure in her neck but not associated with exertion.  She denies any chest pain or pressure.  She has some chronic chest wall pain from prior shingles and also has fibromyalgia.  She has has some RLE edema as well.  She has been having some pain in her RLE along with numbness in her toes.  She says that is hurst all the time but is not worse with walking.  She gets cramps in her calves at night.  She has a family history of CAD and her father had PVD.  She is a smoker and smokes 1ppd for 16 years.  She does complain of exertional fatigue with minimal exertion.    Past Medical History  Diagnosis Date  . Iritis   . History of bronchitis   . GERD (gastroesophageal reflux disease)   . Diverticulosis of colon   . IBS (irritable bowel syndrome)   . Unspecified disorder of biliary tract   . UTI (lower urinary tract infection)   . Interstitial cystitis   . Lumbar back pain   . Fibromyalgia   . Headache(784.0)   . Dysthymia   . History of shingles   . CAD (coronary artery disease)     Moderate nonobstructive by cath 06/2012  . Anxiety   . Colon polyps   . Depression   . Gallstones   . HTN (hypertension)   . HLD (hyperlipidemia)   . Pneumonia     Past Surgical History  Procedure Laterality Date  . Appendectomy  1973  . Hysterectomy and right oopherectomy  1989  . Cholecystectomy  1973  . Common bile duct stent by ercp w/sphincterotomy    . Tonsillectomy    . Tubal ligation    . Cspine  surgery  05/2010    by Dr. Hal Neer  . Left heart catheterization with coronary angiogram N/A 06/20/2012    Procedure: LEFT HEART CATHETERIZATION WITH CORONARY ANGIOGRAM;  Surgeon: Hillary Bow, MD;  Location: [facility] CATH LAB;  Service: Cardiovascular;  Laterality: N/A;     Current Outpatient Prescriptions  Medication Sig Dispense Refill  . amitriptyline (ELAVIL) 25 MG tablet Take 75 mg by mouth at bedtime.    [name] aspirin EC 81 MG EC tablet Take 1 tablet (81 mg total) by mouth daily.    [name] dicyclomine (BENTYL) 20 MG tablet Take 20 mg by mouth 3 (three) times daily before meals.    . gabapentin (NEURONTIN) 400 MG capsule Take 400 mg by mouth 3 (three) times daily.    [name] LORazepam (ATIVAN) 2 MG tablet Take 2 mg by mouth 3 (three) times daily.    . Multiple Vitamins-Minerals (WOMENS MULTIVITAMIN PLUS) TABS Take 1 tablet by mouth daily.      . naproxen sodium (ALEVE) 220 MG tablet Take 1-2 tablets (220-440 mg total) by mouth daily as needed (for neck pain). Take  with food.    [name] omeprazole (PRILOSEC) 20 MG capsule Take 20 mg by mouth 2 (two) times daily before a meal.    . oxyCODONE-acetaminophen (PERCOCET) 10-325 MG per tablet Take 1 tablet by mouth 3 (three) times daily.    [name] tiZANidine (ZANAFLEX) 4 MG tablet Take 4 mg by mouth 2 (two) times daily.    . traMADol (ULTRAM) 50 MG tablet Take 50 mg by mouth 3 (three) times daily.    . vitamin B-12 (CYANOCOBALAMIN) 1000 MCG tablet Take 1,000 mcg by mouth daily.     No current facility-administered medications for this visit.    Allergies:   Compazine; Keflex; Metoclopramide hcl; Prochlorperazine edisylate; Reglan; and Varenicline tartrate    Social History:  The patient  reports that she has been smoking Cigarettes.  She has been smoking about 0.50 packs per day. She has never used smokeless tobacco. She reports that she does not drink alcohol or use illicit drugs.   Family History:  The patient's family history includes Colon cancer in her paternal  uncle; Heart disease in her father.    ROS:  Please see the history of present illness.   Otherwise, review of systems are positive for none.   All other systems are reviewed and negative.    PHYSICAL EXAM: VS:  BP 110/78 mmHg  Pulse 77  Ht 5\' 4"  (1.626 m)  Wt 160 lb 6.4 oz (72.757 kg)  BMI 27.52 kg/m2  SpO2 98% , BMI Body mass index is 27.52 kg/(m^2). GEN: Well nourished, well developed, in no acute distress HEENT: normal Neck: no JVD, carotid bruits, or masses Cardiac: RRR; no murmurs, rubs, or gallops.  RLE  edema  Respiratory:  clear to auscultation bilaterally, normal work of breathing GI: soft, nontender, nondistended, + BS MS: no deformity or atrophy Skin: warm and dry, no rash Neuro:  Strength and sensation are intact Psych: euthymic mood, full affect   EKG:  EKG is not ordered today.    Recent Labs: No results found for requested labs within last 365 days.    Lipid Panel    Component Value Date/Time   CHOL 126 01/17/2013 0430   TRIG 40 01/17/2013 0430   HDL 58 01/17/2013 0430   CHOLHDL 2.2 01/17/2013 0430   VLDL 8 01/17/2013 0430   LDLCALC 60 01/17/2013 0430   LDLDIRECT 123.7 06/05/2012 1129      Wt Readings from Last 3 Encounters:  03/12/15 160 lb 6.4 oz (72.757 kg)  04/22/14 157 lb (71.215 kg)  02/12/14 157 lb 4 oz (71.328 kg)      ASSESSMENT/PLAN: 1.  Moderate nonobstructive ASCAD with no CP.  Continue ASA.  She continues to smoke and is complaining of exertional fatigue which may be an anginal equivalent.  I will get a Lexiscan myoview to rule out ischemia.   2.  HTN - controlled 3.  Dyslipidemia - currently not on a statin.  I wil check an FLP and ALT 4.  Tobacco abuse - I have encouraged her to try to cut back and eventually get off cigarettes 5.  RLE pain and swelling of ? Etiology.  She has a good dorsalis pedis pulse.  I will get bilateral arterial and venous dopplers to assess for PVD and DVT given swelling.  Current medicines are  reviewed at length with the patient today.  The patient does not have concerns regarding medicines.  The following changes have been made:  no change  Labs/ tests ordered today: See above Assessment  and Plan No orders of the defined types were placed in this encounter.     Disposition:   FU with me in 1 months  Signed, Sueanne Margarita, MD  03/12/2015 9:54 AM    [facility] [address], [place], [place]  [address] Phone: [phone]; Fax: [phone]

## 2015-03-12 ENCOUNTER — Ambulatory Visit (INDEPENDENT_AMBULATORY_CARE_PROVIDER_SITE_OTHER): Payer: Medicare Other | Admitting: Cardiology

## 2015-03-12 ENCOUNTER — Encounter: Payer: Self-pay | Admitting: Cardiology

## 2015-03-12 VITALS — BP 110/78 | HR 77 | Ht 64.0 in | Wt 160.4 lb

## 2015-03-12 DIAGNOSIS — E785 Hyperlipidemia, unspecified: Secondary | ICD-10-CM | POA: Diagnosis not present

## 2015-03-12 DIAGNOSIS — I2510 Atherosclerotic heart disease of native coronary artery without angina pectoris: Secondary | ICD-10-CM

## 2015-03-12 DIAGNOSIS — Z720 Tobacco use: Secondary | ICD-10-CM

## 2015-03-12 DIAGNOSIS — I739 Peripheral vascular disease, unspecified: Secondary | ICD-10-CM

## 2015-03-12 DIAGNOSIS — M79604 Pain in right leg: Secondary | ICD-10-CM | POA: Diagnosis not present

## 2015-03-12 DIAGNOSIS — T733XXA Exhaustion due to excessive exertion, initial encounter: Secondary | ICD-10-CM

## 2015-03-12 HISTORY — DX: Atherosclerotic heart disease of native coronary artery without angina pectoris: I25.10

## 2015-03-12 NOTE — Patient Instructions (Signed)
Medication Instructions:  Your physician recommends that you continue on your current medications as directed. Please refer to the Current Medication list given to you today.   Labwork: WITHIN ONE WEEK: LFTs, Lipids  Testing/Procedures: Dr. Radford Pax recommends you have LOWER ARTERIAL DOPPLERS.  Dr. Radford Pax recommends you have LOWER VENOUS DOPPLERS.  Your physician has requested that you have a lexiscan myoview. For further information please visit [email or web address]. Please follow instruction sheet, as given.  Follow-Up: Your physician wants you to follow-up in: 1 year with Dr. Radford Pax. You will receive a reminder letter in the mail two months in advance. If you don't receive a letter, please call our office to schedule the follow-up appointment.   Any Other Special Instructions Will Be Listed Below (If Applicable).

## 2015-03-17 ENCOUNTER — Telehealth (HOSPITAL_COMMUNITY): Payer: Self-pay

## 2015-03-17 NOTE — Telephone Encounter (Signed)
Patient given detailed instructions per Myocardial Perfusion Study Information Sheet for test on 03-19-2015 at 1145. Patient Notified to arrive 15 minutes early, and that it is imperative to arrive on time for appointment to keep from having the test rescheduled. Patient verbalized understanding per Loma Newton, CNMT/ [facility] A

## 2015-03-19 ENCOUNTER — Telehealth: Payer: Self-pay

## 2015-03-19 ENCOUNTER — Ambulatory Visit (HOSPITAL_COMMUNITY): Payer: Medicare Other | Attending: Cardiovascular Disease

## 2015-03-19 ENCOUNTER — Other Ambulatory Visit (INDEPENDENT_AMBULATORY_CARE_PROVIDER_SITE_OTHER): Payer: Medicare Other | Admitting: *Deleted

## 2015-03-19 DIAGNOSIS — X58XXXA Exposure to other specified factors, initial encounter: Secondary | ICD-10-CM | POA: Insufficient documentation

## 2015-03-19 DIAGNOSIS — Z8249 Family history of ischemic heart disease and other diseases of the circulatory system: Secondary | ICD-10-CM | POA: Diagnosis not present

## 2015-03-19 DIAGNOSIS — E785 Hyperlipidemia, unspecified: Secondary | ICD-10-CM | POA: Diagnosis not present

## 2015-03-19 DIAGNOSIS — T733XXA Exhaustion due to excessive exertion, initial encounter: Secondary | ICD-10-CM | POA: Insufficient documentation

## 2015-03-19 DIAGNOSIS — I2510 Atherosclerotic heart disease of native coronary artery without angina pectoris: Secondary | ICD-10-CM | POA: Insufficient documentation

## 2015-03-19 DIAGNOSIS — F17200 Nicotine dependence, unspecified, uncomplicated: Secondary | ICD-10-CM | POA: Insufficient documentation

## 2015-03-19 DIAGNOSIS — I10 Essential (primary) hypertension: Secondary | ICD-10-CM | POA: Diagnosis not present

## 2015-03-19 DIAGNOSIS — R0609 Other forms of dyspnea: Secondary | ICD-10-CM | POA: Insufficient documentation

## 2015-03-19 LAB — LIPID PANEL
CHOL/HDL RATIO: 3
CHOLESTEROL: 188 mg/dL (ref 0–200)
HDL: 55.7 mg/dL (ref >39.00)
LDL CALC: 119 mg/dL — AB (ref 0–99)
NONHDL: 132.56
Triglycerides: 69 mg/dL (ref 0.0–149.0)
VLDL: 13.8 mg/dL (ref 0.0–40.0)

## 2015-03-19 LAB — MYOCARDIAL PERFUSION IMAGING
CHL CUP NUCLEAR SDS: 0
CHL CUP NUCLEAR SRS: 9
CHL CUP RESTING HR STRESS: 74 {beats}/min
LV dias vol: 97 mL
LV sys vol: 43 mL
Peak HR: 100 {beats}/min
RATE: 0.3
SSS: 9
TID: 1.11

## 2015-03-19 LAB — HEPATIC FUNCTION PANEL
ALK PHOS: 123 U/L — AB (ref 39–117)
ALT: 11 U/L (ref 0–35)
AST: 15 U/L (ref 0–37)
Albumin: 4 g/dL (ref 3.5–5.2)
BILIRUBIN DIRECT: 0.1 mg/dL (ref 0.0–0.3)
Total Bilirubin: 0.5 mg/dL (ref 0.2–1.2)
Total Protein: 7.2 g/dL (ref 6.0–8.3)

## 2015-03-19 MED ORDER — REGADENOSON 0.4 MG/5ML IV SOLN
0.4000 mg | Freq: Once | INTRAVENOUS | Status: AC
  Administered 2015-03-19: 0.4 mg via INTRAVENOUS

## 2015-03-19 MED ORDER — TECHNETIUM TC 99M SESTAMIBI GENERIC - CARDIOLITE
10.8000 | Freq: Once | INTRAVENOUS | Status: AC | PRN
  Administered 2015-03-19: 11 via INTRAVENOUS

## 2015-03-19 MED ORDER — TECHNETIUM TC 99M SESTAMIBI GENERIC - CARDIOLITE
30.9000 | Freq: Once | INTRAVENOUS | Status: AC | PRN
  Administered 2015-03-19: 30.9 via INTRAVENOUS

## 2015-03-19 MED ORDER — ATORVASTATIN CALCIUM 40 MG PO TABS: 40.0000 mg | ORAL_TABLET | Freq: Every day | ORAL | Status: AC

## 2015-03-19 NOTE — Telephone Encounter (Signed)
-----   Message from Sueanne Margarita, MD sent at 03/19/2015  3:42 PM EDT ----- LDL goal < 70 so not at goal.  Start Lipitor 40mg  daily and recheck FLP and ALT in 6 weeks

## 2015-03-19 NOTE — Telephone Encounter (Signed)
Informed patient of results and verbal understanding expressed.  Instructed patient to START LIPITOR 40 mg daily. FLP and ALT scheduled 10/24. Patient agrees with treatment plan.

## 2015-03-31 ENCOUNTER — Ambulatory Visit (HOSPITAL_COMMUNITY)
Admission: RE | Admit: 2015-03-31 | Discharge: 2015-03-31 | Disposition: A | Discharge status: Home / Self Care | Payer: Medicare Other | Source: Ambulatory Visit | Attending: Cardiology | Admitting: Cardiology

## 2015-03-31 DIAGNOSIS — F17200 Nicotine dependence, unspecified, uncomplicated: Secondary | ICD-10-CM | POA: Diagnosis not present

## 2015-03-31 DIAGNOSIS — I10 Essential (primary) hypertension: Secondary | ICD-10-CM | POA: Insufficient documentation

## 2015-03-31 DIAGNOSIS — E785 Hyperlipidemia, unspecified: Secondary | ICD-10-CM | POA: Diagnosis not present

## 2015-03-31 DIAGNOSIS — I739 Peripheral vascular disease, unspecified: Secondary | ICD-10-CM

## 2015-03-31 DIAGNOSIS — I2510 Atherosclerotic heart disease of native coronary artery without angina pectoris: Secondary | ICD-10-CM | POA: Insufficient documentation

## 2015-03-31 DIAGNOSIS — M79604 Pain in right leg: Secondary | ICD-10-CM | POA: Insufficient documentation

## 2015-04-07 ENCOUNTER — Inpatient Hospital Stay (HOSPITAL_COMMUNITY): Admission: RE | Admit: 2015-04-07 | Payer: Self-pay | Source: Ambulatory Visit

## 2015-05-12 ENCOUNTER — Other Ambulatory Visit: Payer: Self-pay

## 2015-09-27 ENCOUNTER — Emergency Department: Payer: Medicare Other

## 2015-09-27 ENCOUNTER — Encounter: Payer: Self-pay | Admitting: Emergency Medicine

## 2015-09-27 ENCOUNTER — Emergency Department
Admission: EM | Admit: 2015-09-27 | Discharge: 2015-09-27 | Disposition: A | Discharge status: Home / Self Care | Payer: Medicare Other | Attending: Emergency Medicine | Admitting: Emergency Medicine

## 2015-09-27 DIAGNOSIS — F17210 Nicotine dependence, cigarettes, uncomplicated: Secondary | ICD-10-CM | POA: Insufficient documentation

## 2015-09-27 DIAGNOSIS — Z7982 Long term (current) use of aspirin: Secondary | ICD-10-CM | POA: Insufficient documentation

## 2015-09-27 DIAGNOSIS — I10 Essential (primary) hypertension: Secondary | ICD-10-CM | POA: Diagnosis not present

## 2015-09-27 DIAGNOSIS — K1121 Acute sialoadenitis: Secondary | ICD-10-CM | POA: Diagnosis not present

## 2015-09-27 DIAGNOSIS — R220 Localized swelling, mass and lump, head: Secondary | ICD-10-CM | POA: Diagnosis present

## 2015-09-27 DIAGNOSIS — Z79899 Other long term (current) drug therapy: Secondary | ICD-10-CM | POA: Insufficient documentation

## 2015-09-27 LAB — BASIC METABOLIC PANEL
ANION GAP: 11 (ref 5–15)
BUN: 7 mg/dL (ref 6–20)
CO2: 26 mmol/L (ref 22–32)
Calcium: 9.5 mg/dL (ref 8.9–10.3)
Chloride: 97 mmol/L — ABNORMAL LOW (ref 101–111)
Creatinine, Ser: 0.67 mg/dL (ref 0.44–1.00)
Glucose, Bld: 124 mg/dL — ABNORMAL HIGH (ref 65–99)
POTASSIUM: 3.8 mmol/L (ref 3.5–5.1)
SODIUM: 134 mmol/L — AB (ref 135–145)

## 2015-09-27 LAB — CBC WITH DIFFERENTIAL/PLATELET
BASOS ABS: 0 10*3/uL (ref 0–0.1)
Basophils Relative: 0 %
EOS PCT: 0 %
Eosinophils Absolute: 0 10*3/uL (ref 0–0.7)
HCT: 40.3 % (ref 35.0–47.0)
Hemoglobin: 13.5 g/dL (ref 12.0–16.0)
LYMPHS PCT: 10 %
Lymphs Abs: 1.3 10*3/uL (ref 1.0–3.6)
MCH: 30.4 pg (ref 26.0–34.0)
MCHC: 33.5 g/dL (ref 32.0–36.0)
MCV: 91 fL (ref 80.0–100.0)
Monocytes Absolute: 1 10*3/uL — ABNORMAL HIGH (ref 0.2–0.9)
Monocytes Relative: 8 %
Neutro Abs: 10.6 10*3/uL — ABNORMAL HIGH (ref 1.4–6.5)
Neutrophils Relative %: 82 %
PLATELETS: 267 10*3/uL (ref 150–440)
RBC: 4.43 MIL/uL (ref 3.80–5.20)
RDW: 13.6 % (ref 11.5–14.5)
WBC: 12.9 10*3/uL — AB (ref 3.6–11.0)

## 2015-09-27 MED ORDER — CLINDAMYCIN PHOSPHATE 600 MG/50ML IV SOLN
600.0000 mg | Freq: Once | INTRAVENOUS | Status: AC
  Administered 2015-09-27: 600 mg via INTRAVENOUS
  Filled 2015-09-27: qty 50

## 2015-09-27 MED ORDER — MORPHINE SULFATE (PF) 2 MG/ML IV SOLN
INTRAVENOUS | Status: AC
  Filled 2015-09-27: qty 1

## 2015-09-27 MED ORDER — IOHEXOL 300 MG/ML  SOLN
75.0000 mL | Freq: Once | INTRAMUSCULAR | Status: AC | PRN
  Administered 2015-09-27: 75 mL via INTRAVENOUS
  Filled 2015-09-27: qty 75

## 2015-09-27 MED ORDER — CLINDAMYCIN HCL 300 MG PO CAPS: 300.0000 mg | ORAL_CAPSULE | Freq: Four times a day (QID) | ORAL | Status: DC

## 2015-09-27 MED ORDER — ONDANSETRON HCL 4 MG/2ML IJ SOLN
4.0000 mg | Freq: Once | INTRAMUSCULAR | Status: AC
  Administered 2015-09-27: 4 mg via INTRAVENOUS
  Filled 2015-09-27: qty 2

## 2015-09-27 MED ORDER — MORPHINE SULFATE (PF) 2 MG/ML IV SOLN
2.0000 mg | Freq: Once | INTRAVENOUS | Status: AC
Start: 2015-09-27 — End: 2015-09-27
  Administered 2015-09-27: 2 mg via INTRAVENOUS

## 2015-09-27 MED ORDER — MORPHINE SULFATE (PF) 2 MG/ML IV SOLN
2.0000 mg | Freq: Once | INTRAVENOUS | Status: AC
  Administered 2015-09-27: 2 mg via INTRAVENOUS
  Filled 2015-09-27: qty 1

## 2015-09-27 NOTE — Discharge Instructions (Signed)
Parotitis Parotitis is soreness and inflammation of one or both parotid glands. The parotid glands produce saliva. They are located on each side of the face, below and in front of the earlobes. The saliva produced comes out of tiny openings (ducts) inside the cheeks. In most cases, parotitis goes away over time or with treatment. If your parotitis is caused by certain long-term (chronic) diseases, it may come back again.  CAUSES  Parotitis can be caused by:  Viral infections. Mumps is one viral infection that can cause parotitis.  Bacterial infections.  Blockage of the salivary ducts due to a salivary stone.  Narrowing of the salivary ducts.  Swelling of the salivary ducts.  Dehydration.  Autoimmune conditions, such as sarcoidosis or Sjogren syndrome.  Air from activities such as scuba diving, glass blowing, or playing an instrument (rare).  Human immunodeficiency virus (HIV) or acquired immunodeficiency syndrome (AIDS).  Tuberculosis. SIGNS AND SYMPTOMS   The ears may appear to be pushed up and out from their normal position.  Redness (erythema) of the skin over the parotid glands.  Pain and tenderness over the parotid glands.  Swelling in the parotid gland area.  Yellowish-white fluid (pus) coming from the ducts inside the cheeks.  Dry mouth.  Bad taste in the mouth. DIAGNOSIS  Your health care provider may determine that you have parotitis based on your symptoms and a physical exam. A sample of fluid may also be taken from the parotid gland and tested to find the cause of your infection. X-rays or computed tomography (CT) scans may be taken if your health care provider thinks you might have a salivary stone blocking your salivary duct. TREATMENT  Treatment varies depending upon the cause of your parotitis. If your parotitis is caused by mumps, no treatment is needed. The condition will go away on its own after 7 to 10 days. In other cases, treatment may  include:  Antibiotic medicine if your infection was caused by bacteria.  Pain medicines.  Gland massage.  Eating sour candy to increase your saliva production.  Removal of salivary stones. Your health care provider may flush stones out with fluids or remove them with tweezers.  Surgery to remove the parotid glands. HOME CARE INSTRUCTIONS   If you were prescribed an antibiotic medicine, finish it all even if you start to feel better.  Put warm compresses on the sore area.  Take medicines only as directed by your health care provider.  Drink enough fluids to keep your urine clear or pale yellow. SEEK IMMEDIATE MEDICAL CARE IF:   You have increasing pain or swelling that is not controlled with medicine.  You have a fever. MAKE SURE YOU:  Understand these instructions.  Will watch your condition.  Will get help right away if you are not doing well or get worse.   This information is not intended to replace advice given to you by your health care provider. Make sure you discuss any questions you have with your health care provider.   Document Released: 12/25/2001 Document Revised: 07/26/2014 Document Reviewed: 11/28/2014 Elsevier Interactive Patient Education 2016 [employer]  Salivary Gland Infection A salivary gland infection is an infection in one or more of the glands that produce spit (saliva). You have six major salivary glands. Each gland has a duct that carries saliva into your mouth. Saliva keeps your mouth moist and breaks down the food that you eat. It also helps to prevent tooth decay. Two salivary glands are located just in front of  your ears (parotid). The ducts for these glands open up inside your cheeks, near your back teeth. You also have two glands under your tongue (sublingual) and two glands under your jaw (submandibular). The ducts for these glands open under your tongue. Any salivary gland can become infected. Most infections occur in the parotid glands  or submandibular glands. °CAUSES °Salivary glands can be infected by viruses or bacteria. °· The mumps virus is the most common cause of viral salivary gland infections, though mumps is now rare in many areas because of vaccination. °¨ This infection causes swelling in both parotid glands. °¨ Viral infections are more common in children. °· The bacteria that cause salivary gland infections are usually the same bacteria that normally live in your mouth. °¨ A stone can form in a salivary gland and block the flow of saliva. As a result, saliva backs up into the salivary gland. Bacteria may then start to grow behind the blockage and cause infection. °¨ Bacterial infections usually cause pain and swelling on one side of the face. Submandibular gland swelling occurs under the jaw. Parotid swelling occurs in front of the ear. °¨ Bacterial infections are more common in adults. °RISK FACTORS °Children who do not get the MMR (measles, mumps, rubella) vaccine are more likely to get mumps, which can cause a viral salivary gland infection. °Risk factors for bacterial infections include: °· Poor dental care (oral hygiene). °· Smoking. °· Not drinking enough water. °· Having a disease that causes dry mouth and dry eyes (Mikulicz syndrome or Sjogren syndrome). °SIGNS AND SYMPTOMS °The main sign of salivary gland infection is a swollen salivary gland. This type of inflammation is often called sialadenitis. You may have swelling in front of your ear, under your jaw, or under your tongue. Swelling may get worse when you eat and decrease after you eat. Other signs and symptoms include: °· Pain. °· Tenderness. °· Redness. °· Dry mouth. °· Bad taste in your mouth. °· Difficulty chewing and swallowing. °· Fever. °DIAGNOSIS °Your health care provider may suspect a salivary gland infection based on your signs and symptoms. He or she will also do a physical exam. The health care provider will look and feel inside your mouth to see whether a  stone is blocking a salivary gland duct. You may need to see an ear, nose, and throat specialist (ENT or otolaryngologist) for diagnosis and treatment. You may also need to have diagnostic tests, such as: °· An X-ray to check for a stone. °· Other imaging studies to look for an abscess and to rule out other causes of swelling. These tests may include: °¨ Ultrasound. °¨ CT scan. °¨ MRI. °· Culture and sensitivity test. This involves collecting a sample of pus for testing in the lab to see what bacteria grow and what antibiotics they are sensitive to. The testing sample may be: °¨ Swabbed from a salivary gland duct. °¨ Withdrawn from a swollen gland with a needle (aspiration). °TREATMENT °Viral salivary gland infections usually clear up without treatment. Bacterial infections are usually treated with antibiotic medicine. Severe infections that cause difficulty with swallowing may be treated with an IV antibiotic in the hospital. °Other treatments may include: °· Probing and widening the salivary duct to allow a stone to pass. In some cases, a thin, flexible scope (endoscope) may be inserted into the duct to find a stone and remove it. °· Breaking up a stone using sound waves. °· Draining an infected gland (abscess) with a needle. °· In   some cases, you may need surgery so your health care provider can:  Remove a stone.  Drain pus from an abscess.  Remove a badly infected gland. HOME CARE INSTRUCTIONS  Take medicines only as directed by your health care provider.  If you were prescribed an antibiotic medicine, finish it all even if you start to feel better.  Follow these instructions every few hours:  Suck on a lemon candy to stimulate the flow of saliva.  Put a warm compress over the gland.  Gently massage the gland.  Drink enough fluid to keep your urine clear or pale yellow.  Rinse your mouth with a mixture of warm water and salt every few hours. To make this mixture, add a pinch of salt to 1  cup of warm water.  Practice good oral hygiene by brushing and flossing your teeth after meals and before you go to bed.  Do not use any tobacco products, including cigarettes, chewing tobacco, or electronic cigarettes. If you need help quitting, ask your health care provider. SEEK MEDICAL CARE IF:  You have pain and swelling in your face, jaw, or mouth after eating.  You have persistent swelling in any of these places:  In front of your ear.  Under your jaw.  Inside your mouth. SEEK IMMEDIATE MEDICAL CARE IF:   You have pain and swelling in your face, jaw, or mouth that are getting worse.  Your pain and swelling make it hard to swallow or breathe.   This information is not intended to replace advice given to you by your health care provider. Make sure you discuss any questions you have with your health care provider.   Document Released: 08/12/2004 Document Revised: 07/26/2014 Document Reviewed: 12/05/2013 Elsevier Interactive Patient Education 2016 [place] the prescription antibiotic as directed until completed. Apply warm compresses to promote healing. Increase fluid intake to prevent dry mouth. Eat sour candies to allow gland to squeeze. Follow-up with Dr. Kathyrn Sheriff in 2 week as needed.

## 2015-09-27 NOTE — ED Provider Notes (Signed)
[facility] Emergency Department Provider Note ____________________________________________  Time seen: 1302  I have reviewed the triage vital signs and the nursing notes.  HISTORY  Chief Complaint  Otalgia  HPI Diane Williams is a 65 y.o. female presents to the ED, covered by her adult daughter, for evaluation of left-sided earache and facial swelling the last 3 day she describes objective fever, swelling, and pain associated with swelling to the left jaw in the area in front of the ear. She gives a remote history of a similar episode about 7 years prior, treated as an inpatient for an acute salivary gland infection. She reports the pain at a 10/10 in triage.  Past Medical History  Diagnosis Date  . Iritis   . History of bronchitis   . GERD (gastroesophageal reflux disease)   . Diverticulosis of colon   . IBS (irritable bowel syndrome)   . Unspecified disorder of biliary tract   . UTI (lower urinary tract infection)   . Interstitial cystitis   . Lumbar back pain   . Fibromyalgia   . Headache(784.0)   . Dysthymia   . History of shingles   . CAD (coronary artery disease)     Moderate nonobstructive by cath 06/2012  . Anxiety   . Colon polyps   . Depression   . Gallstones   . HTN (hypertension)   . HLD (hyperlipidemia)   . Pneumonia   . CAD (coronary artery disease), native coronary artery 03/12/2015    Patient Active Problem List   Diagnosis Date Noted  . Leg pain, right 03/12/2015  . Claudication ([place]) 03/12/2015  . CAD (coronary artery disease), native coronary artery 03/12/2015  . Abdominal pain, periumbilic 123XX123  . Chronic pain syndrome 01/18/2013  . Polysubstance overdose 01/17/2013  . Altered mental status 01/17/2013  . Encephalopathy acute 01/17/2013  . Chest pain with moderate risk for cardiac etiology 06/19/2012  . Hyperlipidemia 06/19/2012  . Tobacco abuse 06/19/2012  . Chronic neck pain 06/19/2012  . Anxiety 06/19/2012  .  Venous insufficiency 06/05/2012  . Depression 11/02/2010  . 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 SHINGLES 06/22/2010  . NONSPECIFIC ABN FINDING RAD & OTH EXAM GI TRACT 06/19/2009  . NONSPECIFIC ABNORMAL RESULTS LIVR FUNCTION STUDY 06/19/2009  . ACUTE BRONCHITIS 06/16/2009  . VITAMIN B12 DEFICIENCY 05/29/2009  . Personal history of colonic polyps 05/29/2009  . HEMORRHAGE OF RECTUM AND ANUS 01/29/2009  . ABDOMINAL PAIN RIGHT UPPER QUADRANT 01/29/2009  . GASTROENTERITIS 11/14/2008  . PAIN IN JOINT, SITE UNSPECIFIED 11/11/2007  . BACK PAIN 11/11/2007  . Fatigue due to excessive exertion 11/11/2007  . DYSTHYMIA 09/16/2007  . CIGARETTE SMOKER 09/16/2007  . IRITIS 09/16/2007  . BRONCHITIS 09/16/2007  . DIVERTICULOSIS OF COLON 09/16/2007  . UNSPECIFIED DISORDER OF BILIARY TRACT 09/16/2007  . INTERSTITIAL CYSTITIS 09/16/2007  . URINARY TRACT INFECTION 09/16/2007  . GERD 05/18/2007  . IRRITABLE BOWEL SYNDROME 05/18/2007  . BACK PAIN, LUMBAR 05/18/2007  . FIBROMYALGIA 05/18/2007  . HEADACHE 05/18/2007    Past Surgical History  Procedure Laterality Date  . Appendectomy  1973  . Hysterectomy and right oopherectomy  1989  . Cholecystectomy  1973  . Common bile duct stent by ercp w/sphincterotomy    . Tonsillectomy    . Tubal ligation    . Cspine surgery  05/2010    by Dr. Hal Neer  . Left heart catheterization with coronary angiogram N/A 06/20/2012    Procedure: LEFT HEART CATHETERIZATION WITH CORONARY ANGIOGRAM;  Surgeon: Hillary Bow, MD;  Location: [place] CATH LAB;  Service: Cardiovascular;  Laterality: N/A;    Current Outpatient Rx  Name  Route  Sig  Dispense  Refill  . amitriptyline (ELAVIL) 25 MG tablet   Oral   Take 75 mg by mouth at bedtime.         [name] aspirin EC 81 MG EC tablet   Oral   Take 1 tablet (81 mg total) by mouth daily.         [name] atorvastatin (LIPITOR) 40 MG tablet   Oral   Take 1 tablet (40 mg total) by mouth daily.   30 tablet   11   . clindamycin  (CLEOCIN) 300 MG capsule   Oral   Take 1 capsule (300 mg total) by mouth 4 (four) times daily.   40 capsule   0   . dicyclomine (BENTYL) 20 MG tablet   Oral   Take 20 mg by mouth 3 (three) times daily before meals.         . gabapentin (NEURONTIN) 400 MG capsule   Oral   Take 400 mg by mouth 3 (three) times daily.         [name] LORazepam (ATIVAN) 2 MG tablet   Oral   Take 2 mg by mouth 3 (three) times daily.         . Multiple Vitamins-Minerals (WOMENS MULTIVITAMIN PLUS) TABS   Oral   Take 1 tablet by mouth daily.           . naproxen sodium (ALEVE) 220 MG tablet   Oral   Take 1-2 tablets (220-440 mg total) by mouth daily as needed (for neck pain). Take with food.           You can try this medicine for your neck pain, but  ...   . omeprazole (PRILOSEC) 20 MG capsule   Oral   Take 20 mg by mouth 2 (two) times daily before a meal.         . oxyCODONE-acetaminophen (PERCOCET) 10-325 MG per tablet   Oral   Take 1 tablet by mouth 3 (three) times daily.         [name] tiZANidine (ZANAFLEX) 4 MG tablet   Oral   Take 4 mg by mouth 2 (two) times daily.         . traMADol (ULTRAM) 50 MG tablet   Oral   Take 50 mg by mouth 3 (three) times daily.         . vitamin B-12 (CYANOCOBALAMIN) 1000 MCG tablet   Oral   Take 1,000 mcg by mouth daily.          Allergies Compazine; Keflex; Metoclopramide hcl; Prochlorperazine edisylate; Reglan; and Varenicline tartrate  Family History  Problem Relation Age of Onset  . Colon cancer Paternal Uncle   . Heart disease Father    Social History Social History  Substance Use Topics  . Smoking status: Current Every Day Smoker -- 0.50 packs/day    Types: Cigarettes  . Smokeless tobacco: Never Used     Comment: 2 packs per week  . Alcohol Use: No     Comment: rare use   Review of Systems  Constitutional: Negative for fever. Eyes: Negative for visual changes. ENT: Negative for sore throat. Facial swelling as  above Cardiovascular: Negative for chest pain. Respiratory: Negative for shortness of breath. Gastrointestinal: Negative for abdominal pain, vomiting and diarrhea. Genitourinary: Negative for dysuria. Musculoskeletal: Negative for back pain. Skin: Negative for rash. Neurological: Negative for  headaches, focal weakness or numbness. ____________________________________________  PHYSICAL EXAM:  VITAL SIGNS: ED Triage Vitals  Enc Vitals Group     BP 09/27/15 1212 132/89 mmHg     Pulse Rate 09/27/15 1212 105     Resp 09/27/15 1212 20     Temp 09/27/15 1212 98.5 F (36.9 C)     Temp Source 09/27/15 1212 Oral     SpO2 09/27/15 1212 97 %     Weight 09/27/15 1212 160 lb (72.576 kg)     Height 09/27/15 1212 5\' 7"  (1.702 m)     Head Cir --      Peak Flow --      Pain Score 09/27/15 1213 10     Pain Loc --      Pain Edu? --      Excl. in [place]? --    Constitutional: Alert and oriented. Well appearing and in no distress. Head: Normocephalic and atraumatic. The left face shows moderate subtle swelling to the left cheek from the preauricular area down around the angle of the jaw, consistent with parotitis. The left parotid gland is firm, tender, and tight upon palpation.      Eyes: Conjunctivae are normal. PERRL. Normal extraocular movements      Ears: Canals clear. TMs intact bilaterally. Left canal with some slight narrowing and edema compared to the right. Otherwise patent and without otorrhea.   Nose: No congestion/rhinorrhea.   Mouth/Throat: Mucous membranes are moist.   Neck: Supple. No thyromegaly. Hematological/Lymphatic/Immunological: No cervical lymphadenopathy. Cardiovascular: Normal rate, regular rhythm.  Respiratory: Normal respiratory effort. No wheezes/rales/rhonchi. Gastrointestinal: Soft and nontender. No distention. Musculoskeletal: Nontender with normal range of motion in all extremities.  Neurologic:  Normal gait without ataxia. Normal speech and language. No  gross focal neurologic deficits are appreciated. Skin:  Skin is warm, dry and intact. No rash noted. Psychiatric: Mood and affect are normal. Patient exhibits appropriate insight and judgment. ____________________________________________   LABS (pertinent positives/negatives) Labs Reviewed  BASIC METABOLIC PANEL - Abnormal; Notable for the following:    Sodium 134 (*)    Chloride 97 (*)    Glucose, Bld 124 (*)    All other components within normal limits  CBC WITH DIFFERENTIAL/PLATELET - Abnormal; Notable for the following:    WBC 12.9 (*)    Neutro Abs 10.6 (*)    Monocytes Absolute 1.0 (*)    All other components within normal limits  ____________________________________________   RADIOLOGY  Soft Tissue Neck CT IMPRESSION: Marked left parotid enlargement with surrounding inflammatory stranding consistent with acute parotitis. ____________________________________________  PROCEDURES  Morphine 2 mg IVP x 2 Zofran 4 mg IVP Clindamycin 600 mg IVPB ____________________________________________  INITIAL IMPRESSION / ASSESSMENT AND PLAN / ED COURSE  ----------------------------------------- 4:16 PM on 09/27/2015 -----------------------------------------  Spoke with Dr. Kathyrn Sheriff. He agrees with current plan for IV antibiotics and PO antibiotic for discharge. He wants her to increase fluid and water intake, avoiding carbonated drinks. She should also eat sour candies, apply warm compresses and dose the PO Clindamycin as directed. She will follow-up in 1-2 weeks, as needed.  ____________________________________________  FINAL CLINICAL IMPRESSION(S) / ED DIAGNOSES  Final diagnoses:  Parotitis, acute      Melvenia Needles, PA-C 09/27/15 1709  Daymon Larsen, MD 09/29/15 1155

## 2015-09-27 NOTE — ED Notes (Signed)
Pt to ed with c/o left side earache x 3 days.  Pt with swelling to left neck area.

## 2016-09-08 ENCOUNTER — Other Ambulatory Visit: Payer: Self-pay | Admitting: Internal Medicine

## 2016-09-08 ENCOUNTER — Ambulatory Visit
Admission: RE | Admit: 2016-09-08 | Discharge: 2016-09-08 | Disposition: A | Discharge status: Home / Self Care | Payer: Medicare Other | Source: Ambulatory Visit | Attending: Internal Medicine | Admitting: Internal Medicine

## 2016-09-08 DIAGNOSIS — R059 Cough, unspecified: Secondary | ICD-10-CM

## 2016-09-08 DIAGNOSIS — R05 Cough: Secondary | ICD-10-CM

## 2019-04-24 ENCOUNTER — Encounter: Payer: Self-pay | Admitting: Gastroenterology

## 2019-12-19 ENCOUNTER — Emergency Department (HOSPITAL_COMMUNITY)
Admission: EM | Admit: 2019-12-19 | Discharge: 2019-12-19 | Disposition: A | Discharge status: Home / Self Care | Payer: Medicare Other | Attending: Emergency Medicine | Admitting: Emergency Medicine

## 2019-12-19 ENCOUNTER — Emergency Department (HOSPITAL_COMMUNITY): Payer: Medicare Other

## 2019-12-19 ENCOUNTER — Encounter (HOSPITAL_COMMUNITY): Payer: Self-pay

## 2019-12-19 ENCOUNTER — Other Ambulatory Visit: Payer: Self-pay

## 2019-12-19 DIAGNOSIS — Z7982 Long term (current) use of aspirin: Secondary | ICD-10-CM | POA: Diagnosis not present

## 2019-12-19 DIAGNOSIS — E876 Hypokalemia: Secondary | ICD-10-CM | POA: Diagnosis not present

## 2019-12-19 DIAGNOSIS — F17210 Nicotine dependence, cigarettes, uncomplicated: Secondary | ICD-10-CM | POA: Diagnosis not present

## 2019-12-19 DIAGNOSIS — I2510 Atherosclerotic heart disease of native coronary artery without angina pectoris: Secondary | ICD-10-CM | POA: Diagnosis not present

## 2019-12-19 DIAGNOSIS — Z79899 Other long term (current) drug therapy: Secondary | ICD-10-CM | POA: Diagnosis not present

## 2019-12-19 DIAGNOSIS — R1013 Epigastric pain: Secondary | ICD-10-CM | POA: Diagnosis not present

## 2019-12-19 DIAGNOSIS — R1010 Upper abdominal pain, unspecified: Secondary | ICD-10-CM

## 2019-12-19 LAB — CBC
HCT: 44.8 % (ref 36.0–46.0)
Hemoglobin: 14.5 g/dL (ref 12.0–15.0)
MCH: 30.7 pg (ref 26.0–34.0)
MCHC: 32.4 g/dL (ref 30.0–36.0)
MCV: 94.7 fL (ref 80.0–100.0)
Platelets: 342 10*3/uL (ref 150–400)
RBC: 4.73 MIL/uL (ref 3.87–5.11)
RDW: 13.1 % (ref 11.5–15.5)
WBC: 10.5 10*3/uL (ref 4.0–10.5)
nRBC: 0 % (ref 0.0–0.2)

## 2019-12-19 LAB — TROPONIN I (HIGH SENSITIVITY)
Troponin I (High Sensitivity): 3 ng/L (ref <18)
Troponin I (High Sensitivity): 3 ng/L (ref <18)

## 2019-12-19 LAB — COMPREHENSIVE METABOLIC PANEL
ALT: 15 U/L (ref 0–44)
AST: 16 U/L (ref 15–41)
Albumin: 4 g/dL (ref 3.5–5.0)
Alkaline Phosphatase: 108 U/L (ref 38–126)
Anion gap: 12 (ref 5–15)
BUN: 9 mg/dL (ref 8–23)
CO2: 27 mmol/L (ref 22–32)
Calcium: 9.3 mg/dL (ref 8.9–10.3)
Chloride: 102 mmol/L (ref 98–111)
Creatinine, Ser: 0.46 mg/dL (ref 0.44–1.00)
GFR calc Af Amer: >60 mL/min (ref >60)
GFR calc non Af Amer: >60 mL/min (ref >60)
Glucose, Bld: 107 mg/dL — ABNORMAL HIGH (ref 70–99)
Potassium: 3.2 mmol/L — ABNORMAL LOW (ref 3.5–5.1)
Sodium: 141 mmol/L (ref 135–145)
Total Bilirubin: 0.5 mg/dL (ref 0.3–1.2)
Total Protein: 7.6 g/dL (ref 6.5–8.1)

## 2019-12-19 LAB — LIPASE, BLOOD: Lipase: 19 U/L (ref 11–51)

## 2019-12-19 MED ORDER — MORPHINE SULFATE (PF) 4 MG/ML IV SOLN
4.0000 mg | Freq: Once | INTRAVENOUS | Status: AC
  Administered 2019-12-19: 4 mg via INTRAVENOUS
  Filled 2019-12-19: qty 1

## 2019-12-19 MED ORDER — SODIUM CHLORIDE 0.9% FLUSH: 3.0000 mL | Freq: Once | INTRAVENOUS | Status: DC

## 2019-12-19 MED ORDER — IOHEXOL 300 MG/ML  SOLN
100.0000 mL | Freq: Once | INTRAMUSCULAR | Status: AC | PRN
  Administered 2019-12-19: 100 mL via INTRAVENOUS

## 2019-12-19 MED ORDER — SUCRALFATE 1 G PO TABS
1.0000 g | ORAL_TABLET | Freq: Four times a day (QID) | ORAL | 0 refills | Status: AC
Start: 2019-12-19 — End: ?

## 2019-12-19 MED ORDER — SODIUM CHLORIDE 0.9 % IV SOLN: INTRAVENOUS | Status: DC

## 2019-12-19 MED ORDER — POTASSIUM CHLORIDE CRYS ER 20 MEQ PO TBCR
40.0000 meq | EXTENDED_RELEASE_TABLET | Freq: Once | ORAL | Status: AC
  Administered 2019-12-19: 40 meq via ORAL
  Filled 2019-12-19: qty 2

## 2019-12-19 MED ORDER — LORAZEPAM 2 MG/ML IJ SOLN
0.5000 mg | Freq: Once | INTRAMUSCULAR | Status: AC
  Administered 2019-12-19: 0.5 mg via INTRAVENOUS
  Filled 2019-12-19: qty 1

## 2019-12-19 MED ORDER — ONDANSETRON 8 MG PO TBDP
8.0000 mg | ORAL_TABLET | Freq: Three times a day (TID) | ORAL | 0 refills | Status: DC | PRN
Start: 2019-12-19 — End: 2020-03-14

## 2019-12-19 MED ORDER — SODIUM CHLORIDE 0.9 % IV BOLUS
1000.0000 mL | Freq: Once | INTRAVENOUS | Status: AC
  Administered 2019-12-19: 1000 mL via INTRAVENOUS

## 2019-12-19 MED ORDER — ONDANSETRON 4 MG PO TBDP
4.0000 mg | ORAL_TABLET | Freq: Once | ORAL | Status: AC | PRN
  Administered 2019-12-19: 4 mg via ORAL
  Filled 2019-12-19: qty 1

## 2019-12-19 MED ORDER — ONDANSETRON HCL 4 MG/2ML IJ SOLN
4.0000 mg | Freq: Once | INTRAMUSCULAR | Status: AC
  Administered 2019-12-19: 4 mg via INTRAVENOUS
  Filled 2019-12-19: qty 2

## 2019-12-19 NOTE — Discharge Instructions (Signed)
Follow up with your GI doctor

## 2019-12-19 NOTE — ED Triage Notes (Signed)
Patient c/o intermittent mid chest pain x 3-4 days. Patient states she has had N/v/D x 2-3 weeks. Patient went to her PCP today and was told to come to the ED for possible dehydration.

## 2019-12-19 NOTE — ED Provider Notes (Signed)
[place] DEPT Provider Note   CSN: [id number] Arrival date & time: 12/19/19  1456     History Chief Complaint  Patient presents with  . Chest Pain  . Emesis  . Diarrhea    Graycie Leathem is a 69 y.o. female.  69 year old female presents with several days of upper epigastric pain which radiates to her upper chest.  No cardiac symptoms associated with this.  Has had nonbilious emesis with some watery diarrhea.  No fever or chills.  Denies any urinary symptoms.  No exertional component to this.  Has had this before in the past has had multiple EGDs and has been seen by Dr. Deatra Ina in the past.  Symptoms are worse with eating.  No treatment use prior to arrival        Past Medical History:  Diagnosis Date  . Anxiety   . CAD (coronary artery disease)    Moderate nonobstructive by cath 06/2012  . CAD (coronary artery disease), native coronary artery 03/12/2015  . Colon polyps   . Depression   . Diverticulosis of colon   . Dysthymia   . Fibromyalgia   . Gallstones   . GERD (gastroesophageal reflux disease)   . Headache(784.0)   . History of bronchitis   . History of shingles   . HLD (hyperlipidemia)   . HTN (hypertension)   . IBS (irritable bowel syndrome)   . Interstitial cystitis   . Iritis   . Lumbar back pain   . Pneumonia   . Unspecified disorder of biliary tract   . UTI (lower urinary tract infection)     Patient Active Problem List   Diagnosis Date Noted  . Leg pain, right 03/12/2015  . Claudication ([place]) 03/12/2015  . CAD (coronary artery disease), native coronary artery 03/12/2015  . Abdominal pain, periumbilic 123XX123  . Chronic pain syndrome 01/18/2013  . Polysubstance overdose 01/17/2013  . Altered mental status 01/17/2013  . Encephalopathy acute 01/17/2013  . Chest pain with moderate risk for cardiac etiology 06/19/2012  . Hyperlipidemia 06/19/2012  . Tobacco abuse 06/19/2012  . Chronic neck pain 06/19/2012  .  Anxiety 06/19/2012  . Venous insufficiency 06/05/2012  . Depression 11/02/2010  . 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 SHINGLES 06/22/2010  . NONSPECIFIC ABN FINDING RAD & OTH EXAM GI TRACT 06/19/2009  . NONSPECIFIC ABNORMAL RESULTS LIVR FUNCTION STUDY 06/19/2009  . ACUTE BRONCHITIS 06/16/2009  . VITAMIN B12 DEFICIENCY 05/29/2009  . Personal history of colonic polyps 05/29/2009  . HEMORRHAGE OF RECTUM AND ANUS 01/29/2009  . ABDOMINAL PAIN RIGHT UPPER QUADRANT 01/29/2009  . GASTROENTERITIS 11/14/2008  . PAIN IN JOINT, SITE UNSPECIFIED 11/11/2007  . BACK PAIN 11/11/2007  . Fatigue due to excessive exertion 11/11/2007  . DYSTHYMIA 09/16/2007  . CIGARETTE SMOKER 09/16/2007  . IRITIS 09/16/2007  . BRONCHITIS 09/16/2007  . DIVERTICULOSIS OF COLON 09/16/2007  . UNSPECIFIED DISORDER OF BILIARY TRACT 09/16/2007  . INTERSTITIAL CYSTITIS 09/16/2007  . URINARY TRACT INFECTION 09/16/2007  . GERD 05/18/2007  . IRRITABLE BOWEL SYNDROME 05/18/2007  . BACK PAIN, LUMBAR 05/18/2007  . FIBROMYALGIA 05/18/2007  . HEADACHE 05/18/2007    Past Surgical History:  Procedure Laterality Date  . APPENDECTOMY  1973  . CHOLECYSTECTOMY  1973  . common bile duct stent by ERCP w/sphincterotomy    . cspine surgery  05/2010   by Dr. Hal Neer  . hysterectomy and right oopherectomy  1989  . LEFT HEART CATHETERIZATION WITH CORONARY ANGIOGRAM N/A 06/20/2012   Procedure: LEFT HEART CATHETERIZATION WITH  CORONARY ANGIOGRAM;  Surgeon: Hillary Bow, MD;  Location: [facility] CATH LAB;  Service: Cardiovascular;  Laterality: N/A;  . TONSILLECTOMY    . TUBAL LIGATION       OB History   No obstetric history on file.     Family History  Problem Relation Age of Onset  . Heart disease Father   . Colon cancer Paternal Uncle     Social History   Tobacco Use  . Smoking status: Current Every Day Smoker    Packs/day: 0.50    Types: Cigarettes  . Smokeless tobacco: Never Used  . Tobacco comment: 2 packs per week    Substance Use Topics  . Alcohol use: No    Comment: rare use  . Drug use: No    Home Medications Prior to Admission medications   Medication Sig Start Date End Date Taking? Authorizing Provider  amitriptyline (ELAVIL) 25 MG tablet Take 75 mg by mouth at bedtime.    [provider]  aspirin EC 81 MG EC tablet Take 1 tablet (81 mg total) by mouth daily. 06/21/12   Dunn, Nedra Hai, PA-C  atorvastatin (LIPITOR) 40 MG tablet Take 1 tablet (40 mg total) by mouth daily. 03/19/15   Sueanne Margarita, MD  clindamycin (CLEOCIN) 300 MG capsule Take 1 capsule (300 mg total) by mouth 4 (four) times daily. 09/27/15   Menshew, Dannielle Karvonen, PA-C  dicyclomine (BENTYL) 20 MG tablet Take 20 mg by mouth 3 (three) times daily before meals.    [provider]  gabapentin (NEURONTIN) 400 MG capsule Take 400 mg by mouth 3 (three) times daily.    [provider]  LORazepam (ATIVAN) 2 MG tablet Take 2 mg by mouth 3 (three) times daily.    [provider]  Multiple Vitamins-Minerals (WOMENS MULTIVITAMIN PLUS) TABS Take 1 tablet by mouth daily.      [provider]  naproxen sodium (ALEVE) 220 MG tablet Take 1-2 tablets (220-440 mg total) by mouth daily as needed (for neck pain). Take with food. 06/21/12   Dunn, Nedra Hai, PA-C  omeprazole (PRILOSEC) 20 MG capsule Take 20 mg by mouth 2 (two) times daily before a meal.    [provider]  oxyCODONE-acetaminophen (PERCOCET) 10-325 MG per tablet Take 1 tablet by mouth 3 (three) times daily.    [provider]  tiZANidine (ZANAFLEX) 4 MG tablet Take 4 mg by mouth 2 (two) times daily.    [provider]  traMADol (ULTRAM) 50 MG tablet Take 50 mg by mouth 3 (three) times daily.    [provider]  vitamin B-12 (CYANOCOBALAMIN) 1000 MCG tablet Take 1,000 mcg by mouth daily.    [provider]    Allergies    Compazine [prochlorperazine edisylate], Keflex [cephalexin], Metoclopramide hcl,  Prochlorperazine edisylate, Reglan [metoclopramide], and Varenicline tartrate  Review of Systems   Review of Systems  All other systems reviewed and are negative.   Physical Exam Updated Vital Signs BP (!) 150/89 (BP Location: Left Arm)   Pulse 91   Temp 98.4 F (36.9 C) (Oral)   Resp 15   Ht 1.676 m (5\' 6" )   Wt 78 kg   SpO2 96%   BMI 27.76 kg/m   Physical Exam Vitals and nursing note reviewed.  Constitutional:      General: She is not in acute distress.    Appearance: Normal appearance. She is well-developed. She is not toxic-appearing.  HENT:     Head: Normocephalic and  atraumatic.  Eyes:     General: Lids are normal.     Conjunctiva/sclera: Conjunctivae normal.     Pupils: Pupils are equal, round, and reactive to light.  Neck:     Thyroid: No thyroid mass.     Trachea: No tracheal deviation.  Cardiovascular:     Rate and Rhythm: Normal rate and regular rhythm.     Heart sounds: Normal heart sounds. No murmur. No gallop.   Pulmonary:     Effort: Pulmonary effort is normal. No respiratory distress.     Breath sounds: Normal breath sounds. No stridor. No decreased breath sounds, wheezing, rhonchi or rales.  Abdominal:     General: Bowel sounds are normal. There is no distension.     Palpations: Abdomen is soft.     Tenderness: There is abdominal tenderness in the right upper quadrant and epigastric area. There is no rebound.    Musculoskeletal:        General: No tenderness. Normal range of motion.     Cervical back: Normal range of motion and neck supple.  Skin:    General: Skin is warm and dry.     Findings: No abrasion or rash.  Neurological:     Mental Status: She is alert and oriented to person, place, and time.     GCS: GCS eye subscore is 4. GCS verbal subscore is 5. GCS motor subscore is 6.     Cranial Nerves: No cranial nerve deficit.     Sensory: No sensory deficit.  Psychiatric:        Speech: Speech normal.        Behavior: Behavior normal.      ED Results / Procedures / Treatments   Labs (all labs ordered are listed, but only abnormal results are displayed) Labs Reviewed  CBC  LIPASE, BLOOD  TROPONIN I (HIGH SENSITIVITY)    EKG None ED ECG REPORT   Date: 12/19/2019  Rate: 95  Rhythm: sinus tachycardia  QRS Axis: normal  Intervals: normal  ST/T Wave abnormalities: nonspecific ST changes  Conduction Disutrbances:none  Narrative Interpretation:   Old EKG Reviewed: none available  I have personally reviewed the EKG tracing and agree with the computerized printout as noted.  Radiology No results found.  Procedures Procedures (including critical care time)  Medications Ordered in ED Medications  sodium chloride flush (NS) 0.9 % injection 3 mL (0 mLs Intravenous Hold 12/19/19 1527)  sodium chloride 0.9 % bolus 1,000 mL (has no administration in time range)  0.9 %  sodium chloride infusion (has no administration in time range)  ondansetron (ZOFRAN) injection 4 mg (has no administration in time range)  LORazepam (ATIVAN) injection 0.5 mg (has no administration in time range)  morphine 4 MG/ML injection 4 mg (has no administration in time range)  ondansetron (ZOFRAN-ODT) disintegrating tablet 4 mg (4 mg Oral Given 12/19/19 1523)    ED Course  I have reviewed the triage vital signs and the nursing notes.  Pertinent labs & imaging results that were available during my care of the patient were reviewed by me and considered in my medical decision making (see chart for details).    MDM Rules/Calculators/A&P                      Patient given IV fluids here along with pain medicine and Zofran.  Mild hypokalemia noted potassium of 3.2.  Treated with potassium.  Patient symptoms are worse when she eats and suspect that she  has some element of GERD.  Will place on Carafate and patient shows her to follow-up with her GI doctor Final Clinical Impression(s) / ED Diagnoses Final diagnoses:  None    Rx / DC Orders ED  Discharge Orders    None       Lacretia Leigh, MD 12/19/19 1847

## 2019-12-20 ENCOUNTER — Encounter: Payer: Self-pay | Admitting: Nurse Practitioner

## 2020-01-11 ENCOUNTER — Ambulatory Visit: Payer: Medicare Other | Admitting: Nurse Practitioner

## 2020-01-22 ENCOUNTER — Encounter: Payer: Self-pay | Admitting: Gastroenterology

## 2020-03-14 ENCOUNTER — Ambulatory Visit (INDEPENDENT_AMBULATORY_CARE_PROVIDER_SITE_OTHER): Payer: Medicare Other | Admitting: Gastroenterology

## 2020-03-14 ENCOUNTER — Encounter: Payer: Self-pay | Admitting: Gastroenterology

## 2020-03-14 ENCOUNTER — Telehealth: Payer: Self-pay | Admitting: Gastroenterology

## 2020-03-14 VITALS — BP 122/70 | HR 74 | Ht 65.0 in | Wt 180.0 lb

## 2020-03-14 DIAGNOSIS — R109 Unspecified abdominal pain: Secondary | ICD-10-CM | POA: Diagnosis not present

## 2020-03-14 DIAGNOSIS — R1310 Dysphagia, unspecified: Secondary | ICD-10-CM | POA: Diagnosis not present

## 2020-03-14 MED ORDER — SUPREP BOWEL PREP KIT 17.5-3.13-1.6 GM/177ML PO SOLN: ORAL | 0 refills | Status: AC

## 2020-03-14 MED ORDER — ONDANSETRON 8 MG PO TBDP: 8.0000 mg | ORAL_TABLET | Freq: Three times a day (TID) | ORAL | 0 refills | Status: AC | PRN

## 2020-03-14 MED ORDER — OMEPRAZOLE 40 MG PO CPDR
40.0000 mg | DELAYED_RELEASE_CAPSULE | Freq: Two times a day (BID) | ORAL | 3 refills | Status: DC
Start: 2020-03-14 — End: 2020-07-02

## 2020-03-14 NOTE — Patient Instructions (Signed)
If you are age 69 or older, your body mass index should be between 23-30. Your Body mass index is 29.95 kg/m. If this is out of the aforementioned range listed, please consider follow up with your Primary Care Provider.  If you are age 19 or younger, your body mass index should be between 19-25. Your Body mass index is 29.95 kg/m. If this is out of the aformentioned range listed, please consider follow up with your Primary Care Provider.   You have been scheduled for an endoscopy and colonoscopy. Please follow the written instructions given to you at your visit today. Please pick up your prep supplies at the pharmacy within the next 1-3 days. If you use inhalers (even only as needed), please bring them with you on the day of your procedure.  Increase omeprazole 40 mg to twice a day.   Thank you for trusting me with your gastrointestinal care!    Thornton Park, MD, MPH

## 2020-03-14 NOTE — Telephone Encounter (Signed)
Patient has been notified and aware.  

## 2020-03-14 NOTE — Progress Notes (Signed)
Referring Provider: Lorene Dy, MD Primary Care Physician:  Lorene Dy, MD  Reason for Consultation: epigastric pain, dysphagia   IMPRESSION:  Abdominal pain not explained by contrasted CT scan 6/21    - normal liver enzymes and lipase Diverticulosis in the colon and duodenum History of colon polyps    - tubular adenoma 2010 and 2015 Failed conscious sedation  Etiology of abdominal pain is unclear. Not explained by CT scan, liver enzymes, and lipase. EGD and colonoscopy recommended to evaluate for PUD, esophagitis, gastritis, H pylori, functional abdominal pain.   PLAN: Increase omeprazole to 40 mg BID EGD and colonoscopy  Please see the "Patient Instructions" section for addition details about the plan.  HPI: Diane Williams is a 69 y.o. female referred by the ED for abdominal pain. The history is obtained through the patient and review of her electronic health record.  Seen by Dr. Deatra Ina in 2015. She has a history of IBS and diverticulosis. Completed the Covid vaccine in March/April.  Symptoms started in June.  Now with near constant upper abdominal pain that intermittently radiates to the back.  Associated nausea.  Discomfort when swallowing solids. Pain is relieved by eating. Not changed by defecation or movement.  Some diarrhea controlled with Imodium.  She is concerned that she has an ulcer.  On omeprazole 20 mg BID that has helped with reflux but not the abdominal pain.  Symptoms improved with Zofran.  Temporal association with stress and she notes some increase in stress with a grandchild on the way and discussions about Covid vaccination with her family Occasional use of Naproxen since she started Percocet and Tramadol for fibromyalgias. Avoids other NSAIDs.   Evaluated in the ED 12/19/19 treated with Zofran and Carafate.  CT abd/pelvis with contrast 12/19/19: no acute findings. diverticulosis in the colon and duodenum Liver enzymes and lipase were normal.    Evaluated by Deatra Ina for abdominal pain in 2015. Treated with hyomax. Colonoscopy with Dr. Deatra Ina 1999: ? Left sided colitis, pathology results are not available Colonoscopy with Dr. Deatra Ina 2010 for rectal bleeding:  Sessile transverse colon tubular adenoma, sigmoid tics, internal hemorrhoids Colonoscopy with Dr. Deatra Ina 04/22/14: diverticulosis, 46mm tubular adenoma She has undergone ERCP with stent placement and sphincterotomy in 2008 for a lesion of the biliary tree and possible small stone fragments. She claims this pain is unlike pain she has had in the past so stated with biliary tract disease. She status post cholecystectomy. She notes a history of failed conscious sedation.     Past Medical History:  Diagnosis Date  . Anxiety   . CAD (coronary artery disease)    Moderate nonobstructive by cath 06/2012  . CAD (coronary artery disease), native coronary artery 03/12/2015  . Colon polyps   . Depression   . Diverticulosis of colon   . Dysthymia   . Fibromyalgia   . Gallstones   . GERD (gastroesophageal reflux disease)   . Headache(784.0)   . History of bronchitis   . History of shingles   . HLD (hyperlipidemia)   . HTN (hypertension)   . IBS (irritable bowel syndrome)   . Interstitial cystitis   . Iritis   . Lumbar back pain   . Pneumonia   . Unspecified disorder of biliary tract   . UTI (lower urinary tract infection)     Past Surgical History:  Procedure Laterality Date  . APPENDECTOMY  1973  . CHOLECYSTECTOMY  1973  . common bile duct stent by ERCP w/sphincterotomy    .  cspine surgery  05/2010   by Dr. Hal Neer  . hysterectomy and right oopherectomy  1989  . LEFT HEART CATHETERIZATION WITH CORONARY ANGIOGRAM N/A 06/20/2012   Procedure: LEFT HEART CATHETERIZATION WITH CORONARY ANGIOGRAM;  Surgeon: Hillary Bow, MD;  Location: [facility] CATH LAB;  Service: Cardiovascular;  Laterality: N/A;  . TONSILLECTOMY    . TUBAL LIGATION      Current Outpatient Medications   Medication Sig Dispense Refill  . ALPRAZolam (XANAX) 1 MG tablet Take 1 mg by mouth 3 (three) times daily.    [name] amitriptyline (ELAVIL) 25 MG tablet Take 75 mg by mouth at bedtime.    [name] aspirin EC 81 MG EC tablet Take 1 tablet (81 mg total) by mouth daily.    [name] atorvastatin (LIPITOR) 40 MG tablet Take 1 tablet (40 mg total) by mouth daily. 30 tablet 11  . clindamycin (CLEOCIN) 300 MG capsule Take 1 capsule (300 mg total) by mouth 4 (four) times daily. (Patient not taking: Reported on 12/19/2019) 40 capsule 0  . dicyclomine (BENTYL) 20 MG tablet Take 20 mg by mouth 3 (three) times daily as needed for spasms.     [name] gabapentin (NEURONTIN) 600 MG tablet Take 600 mg by mouth 3 (three) times daily.    . naproxen sodium (ALEVE) 220 MG tablet Take 1-2 tablets (220-440 mg total) by mouth daily as needed (for neck pain). Take with food. (Patient not taking: Reported on 12/19/2019)    . omeprazole (PRILOSEC) 20 MG capsule Take 20 mg by mouth 2 (two) times daily before a meal.    . ondansetron (ZOFRAN ODT) 8 MG disintegrating tablet Take 1 tablet (8 mg total) by mouth every 8 (eight) hours as needed for nausea or vomiting. 20 tablet 0  . oxyCODONE-acetaminophen (PERCOCET) 10-325 MG per tablet Take 1 tablet by mouth 3 (three) times daily.    . sertraline (ZOLOFT) 50 MG tablet Take 50 mg by mouth daily.    . sucralfate (CARAFATE) 1 g tablet Take 1 tablet (1 g total) by mouth 4 (four) times daily. 30 tablet 0  . tiZANidine (ZANAFLEX) 4 MG tablet Take 4 mg by mouth 2 (two) times daily.    . traMADol (ULTRAM) 50 MG tablet Take 50 mg by mouth 3 (three) times daily.     No current facility-administered medications for this visit.    Allergies as of 03/14/2020 - Review Complete 12/19/2019  Allergen Reaction Noted  . Compazine [prochlorperazine edisylate]  02/12/2014  . Keflex [cephalexin]  01/18/2013  . Metoclopramide hcl    . Prochlorperazine edisylate  03/17/2009  . Reglan [metoclopramide]  02/12/2014  .  Varenicline tartrate      Family History  Problem Relation Age of Onset  . Heart disease Father   . Colon cancer Paternal Uncle     Social History   Socioeconomic History  . Marital status: Divorced    Spouse name: Not on file  . Number of children: 3  . Years of education: Not on file  . Highest education level: Not on file  Occupational History  . Not on file  Tobacco Use  . Smoking status: Current Every Day Smoker    Packs/day: 0.50    Types: Cigarettes  . Smokeless tobacco: Never Used  . Tobacco comment: 2 packs per week  Substance and Sexual Activity  . Alcohol use: No    Comment: rare use  . Drug use: No  . Sexual activity: Not on file  Other Topics Concern  .  Not on file  Social History Narrative  . Not on file   Social Determinants of Health   Financial Resource Strain:   . Difficulty of Paying Living Expenses: Not on file  Food Insecurity:   . Worried About Charity fundraiser in the Last Year: Not on file  . Ran Out of Food in the Last Year: Not on file  Transportation Needs:   . Lack of Transportation (Medical): Not on file  . Lack of Transportation (Non-Medical): Not on file  Physical Activity:   . Days of Exercise per Week: Not on file  . Minutes of Exercise per Session: Not on file  Stress:   . Feeling of Stress : Not on file  Social Connections:   . Frequency of Communication with Friends and Family: Not on file  . Frequency of Social Gatherings with Friends and Family: Not on file  . Attends Religious Services: Not on file  . Active Member of Clubs or Organizations: Not on file  . Attends Archivist Meetings: Not on file  . Marital Status: Not on file  Intimate Partner Violence:   . Fear of Current or Ex-Partner: Not on file  . Emotionally Abused: Not on file  . Physically Abused: Not on file  . Sexually Abused: Not on file    Review of Systems: 12 system ROS is negative except as noted above with the additions of anxiety,  depression, fatigue, headaches, muscle pains, insomnia, sore throat, excessive urination, and urinary frequency.   Physical Exam: General:   Alert,  well-nourished, pleasant and cooperative in NAD Head:  Normocephalic and atraumatic. Eyes:  Sclera clear, no icterus.   Conjunctiva pink. Ears:  Normal auditory acuity. Nose:  No deformity, discharge,  or lesions. Mouth:  No deformity or lesions.   Neck:  Supple; no masses or thyromegaly. Lungs:  Clear throughout to auscultation.   No wheezes. Heart:  Regular rate and rhythm; no murmurs. Abdomen:  Soft, central obesity, nontender, nondistended, normal bowel sounds, no rebound or guarding. No hepatosplenomegaly.   Rectal:  Deferred  Msk:  Symmetrical. No boney deformities LAD: No inguinal or umbilical LAD Extremities:  No clubbing or edema. Neurologic:  Alert and  oriented x4;  grossly nonfocal Skin:  Intact without significant lesions or rashes. Psych:  Alert and cooperative. Normal mood and affect.    Diane Williams L. Tarri Glenn, MD, MPH 03/14/2020, 5:11 AM

## 2020-03-14 NOTE — Telephone Encounter (Signed)
Per verbal order from Dr Tarri Glenn, ok to refill at previous dose.  rx has been sent

## 2020-05-15 ENCOUNTER — Encounter: Payer: Medicare Other | Admitting: Gastroenterology

## 2020-05-15 ENCOUNTER — Telehealth: Payer: Self-pay | Admitting: Gastroenterology

## 2020-05-15 NOTE — Telephone Encounter (Signed)
Noted! Thank you

## 2020-05-15 NOTE — Telephone Encounter (Signed)
Hi Dr. Tarri Glenn, this pt just called to cancel her procedure with you this afternoon because she woke up with high fever. She will call back to reschedule. Thank you.

## 2020-07-02 ENCOUNTER — Other Ambulatory Visit: Payer: Self-pay | Admitting: Gastroenterology

## 2020-11-11 ENCOUNTER — Other Ambulatory Visit: Payer: Self-pay | Admitting: Gastroenterology

## 2021-01-09 ENCOUNTER — Other Ambulatory Visit: Payer: Self-pay | Admitting: Gastroenterology

## 2021-02-05 ENCOUNTER — Other Ambulatory Visit: Payer: Self-pay | Admitting: Gastroenterology

## 2021-09-15 ENCOUNTER — Other Ambulatory Visit: Payer: Self-pay | Admitting: Gastroenterology
